# Patient Record
Sex: Female | Born: 1960 | Race: Black or African American | Hispanic: No | Marital: Married | Smoking: Never smoker
Health system: Southern US, Community
[De-identification: ages and names within clinical notes are randomized; demographics above are authoritative.]

## PROBLEM LIST (undated history)

## (undated) DIAGNOSIS — R112 Nausea with vomiting, unspecified: Secondary | ICD-10-CM

## (undated) DIAGNOSIS — E785 Hyperlipidemia, unspecified: Secondary | ICD-10-CM

## (undated) DIAGNOSIS — N19 Unspecified kidney failure: Secondary | ICD-10-CM

## (undated) DIAGNOSIS — Z9889 Other specified postprocedural states: Secondary | ICD-10-CM

## (undated) DIAGNOSIS — D649 Anemia, unspecified: Secondary | ICD-10-CM

## (undated) HISTORY — DX: Anemia, unspecified: D64.9

## (undated) HISTORY — DX: Unspecified kidney failure: N19

## (undated) HISTORY — PX: ABDOMINAL HYSTERECTOMY: SHX81

## (undated) HISTORY — DX: Hyperlipidemia, unspecified: E78.5

## (undated) HISTORY — PX: OTHER SURGICAL HISTORY: SHX169

---

## 2002-01-26 ENCOUNTER — Encounter: Payer: Self-pay | Admitting: *Deleted

## 2002-01-26 ENCOUNTER — Emergency Department (HOSPITAL_COMMUNITY): Admission: EM | Admit: 2002-01-26 | Discharge: 2002-01-26 | Payer: Self-pay | Admitting: *Deleted

## 2002-02-02 ENCOUNTER — Ambulatory Visit (HOSPITAL_COMMUNITY): Admission: RE | Admit: 2002-02-02 | Discharge: 2002-02-02 | Payer: Self-pay | Admitting: Orthopaedic Surgery

## 2002-02-02 ENCOUNTER — Encounter: Payer: Self-pay | Admitting: Orthopaedic Surgery

## 2002-02-17 ENCOUNTER — Encounter (HOSPITAL_COMMUNITY): Admission: RE | Admit: 2002-02-17 | Discharge: 2002-03-19 | Payer: Self-pay | Admitting: Orthopedic Surgery

## 2002-04-20 ENCOUNTER — Ambulatory Visit (HOSPITAL_COMMUNITY): Admission: RE | Admit: 2002-04-20 | Discharge: 2002-04-20 | Payer: Self-pay | Admitting: Obstetrics and Gynecology

## 2002-04-20 ENCOUNTER — Encounter: Payer: Self-pay | Admitting: Obstetrics and Gynecology

## 2003-05-16 ENCOUNTER — Ambulatory Visit (HOSPITAL_COMMUNITY): Admission: RE | Admit: 2003-05-16 | Discharge: 2003-05-16 | Payer: Self-pay | Admitting: Obstetrics and Gynecology

## 2003-06-16 ENCOUNTER — Inpatient Hospital Stay (HOSPITAL_COMMUNITY): Admission: RE | Admit: 2003-06-16 | Discharge: 2003-06-19 | Payer: Self-pay | Admitting: Obstetrics and Gynecology

## 2003-09-26 ENCOUNTER — Inpatient Hospital Stay (HOSPITAL_COMMUNITY): Admission: EM | Admit: 2003-09-26 | Discharge: 2003-10-04 | Payer: Self-pay | Admitting: Emergency Medicine

## 2004-06-02 ENCOUNTER — Emergency Department (HOSPITAL_COMMUNITY): Admission: EM | Admit: 2004-06-02 | Discharge: 2004-06-02 | Payer: Self-pay | Admitting: Emergency Medicine

## 2004-06-04 ENCOUNTER — Inpatient Hospital Stay (HOSPITAL_COMMUNITY): Admission: AD | Admit: 2004-06-04 | Discharge: 2004-06-26 | Payer: Self-pay | Admitting: General Surgery

## 2005-08-09 LAB — HM MAMMOGRAPHY: HM Mammogram: NORMAL

## 2005-08-13 ENCOUNTER — Ambulatory Visit (HOSPITAL_COMMUNITY): Admission: RE | Admit: 2005-08-13 | Discharge: 2005-08-13 | Payer: Self-pay | Admitting: General Surgery

## 2005-10-23 ENCOUNTER — Encounter (INDEPENDENT_AMBULATORY_CARE_PROVIDER_SITE_OTHER): Payer: Self-pay | Admitting: *Deleted

## 2005-10-23 ENCOUNTER — Ambulatory Visit: Payer: Self-pay | Admitting: Family Medicine

## 2005-10-28 ENCOUNTER — Encounter (INDEPENDENT_AMBULATORY_CARE_PROVIDER_SITE_OTHER): Payer: Self-pay | Admitting: Family Medicine

## 2005-10-28 LAB — CONVERTED CEMR LAB: RBC count: 3.94 10*6/uL

## 2005-11-06 ENCOUNTER — Ambulatory Visit: Payer: Self-pay | Admitting: Family Medicine

## 2006-01-16 IMAGING — CR DG CHEST 1V PORT
1 series · 1 of 1 positions shown · non-contrast
Comparison: 10/04/03
 AP semiupright chest at 2331 hours shows low lung volumes with bibasilar atelectasis.

CLINICAL DATA: acute abdominal pain; post-op surgery
 PORTABLE CHEST- 1 VIEW:

[view not recorded]
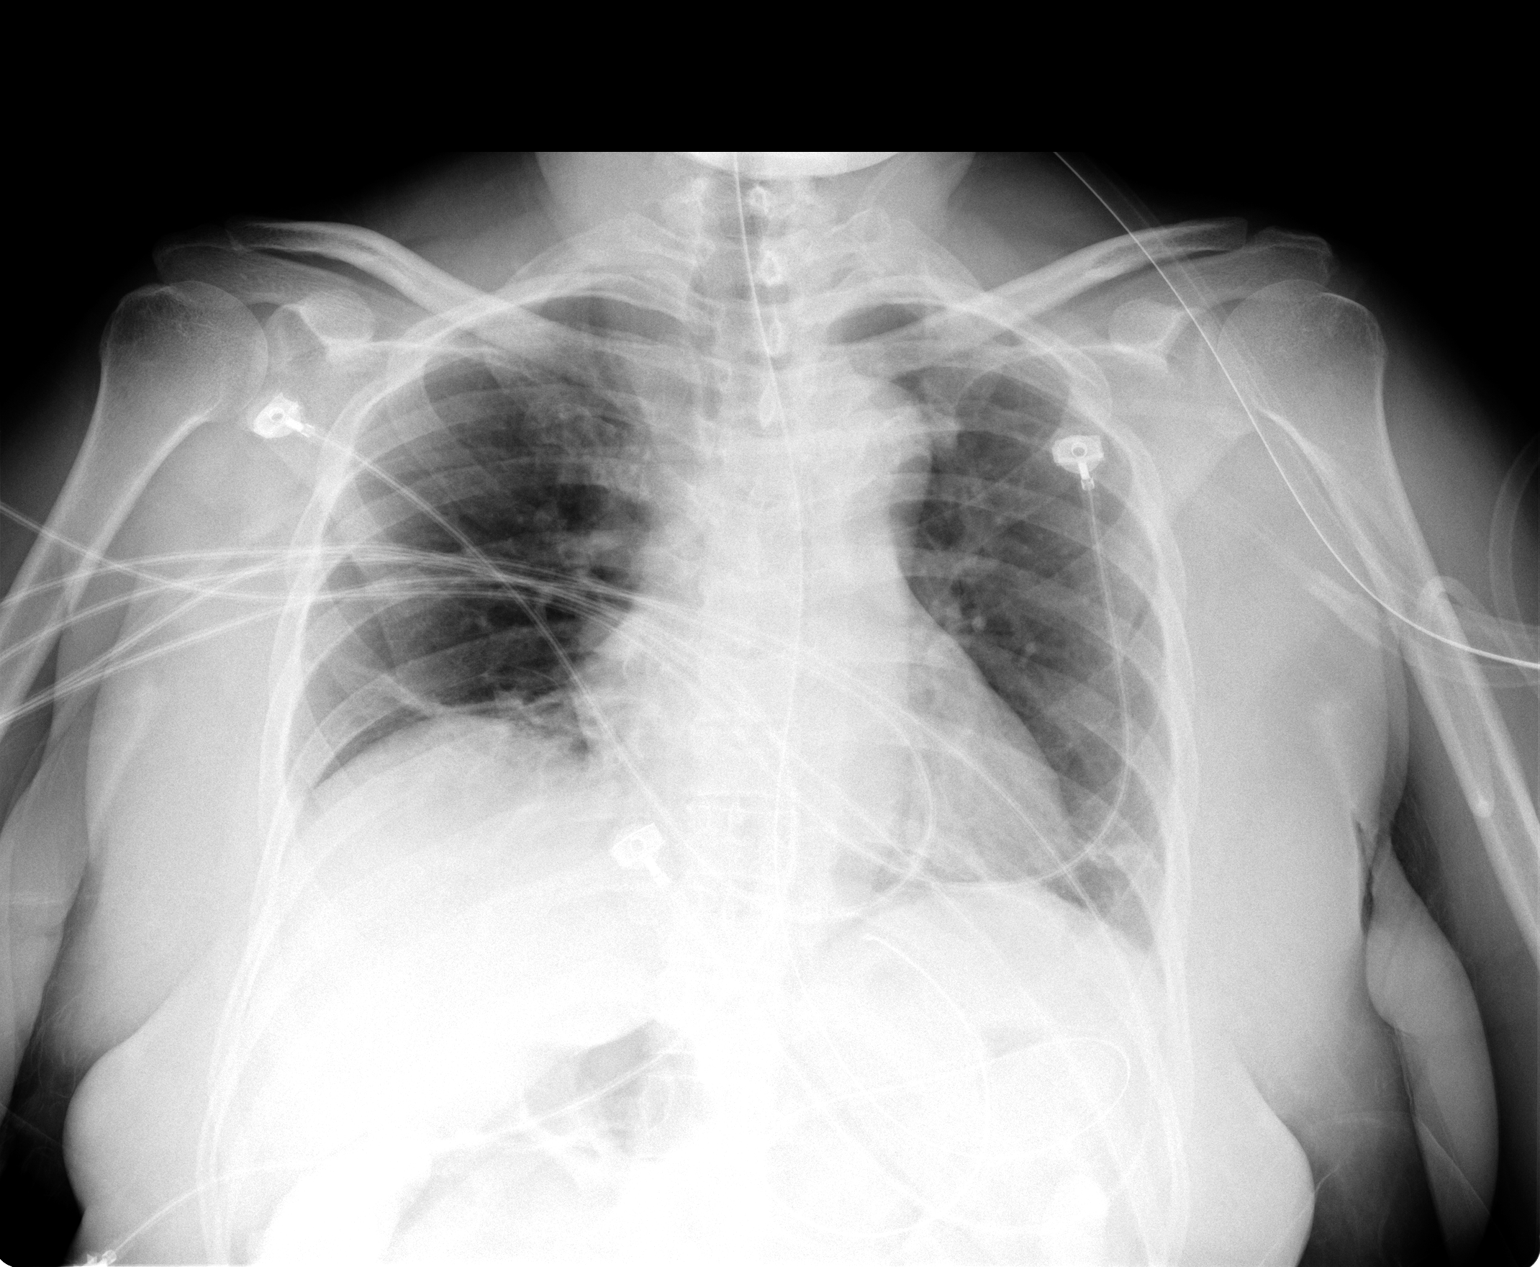

[1 of 1 positions shown; findings below may reference images not displayed]

The cardiopericardial silhouette is at upper limits of normal for size.  NG tube is coiled in the stomach.
IMPRESSION: Low volumes with bibasilar atelectasis.

## 2006-01-20 IMAGING — CR DG ABDOMEN 2V
2 series · 2 of 2 positions shown · non-contrast
Comparison: none

CLINICAL DATA: 43 year old female, acute abdominal pain, possible obstruction. 
 2-VIEW ABDOMINAL SERIES:

[view not recorded (1 of 2)]
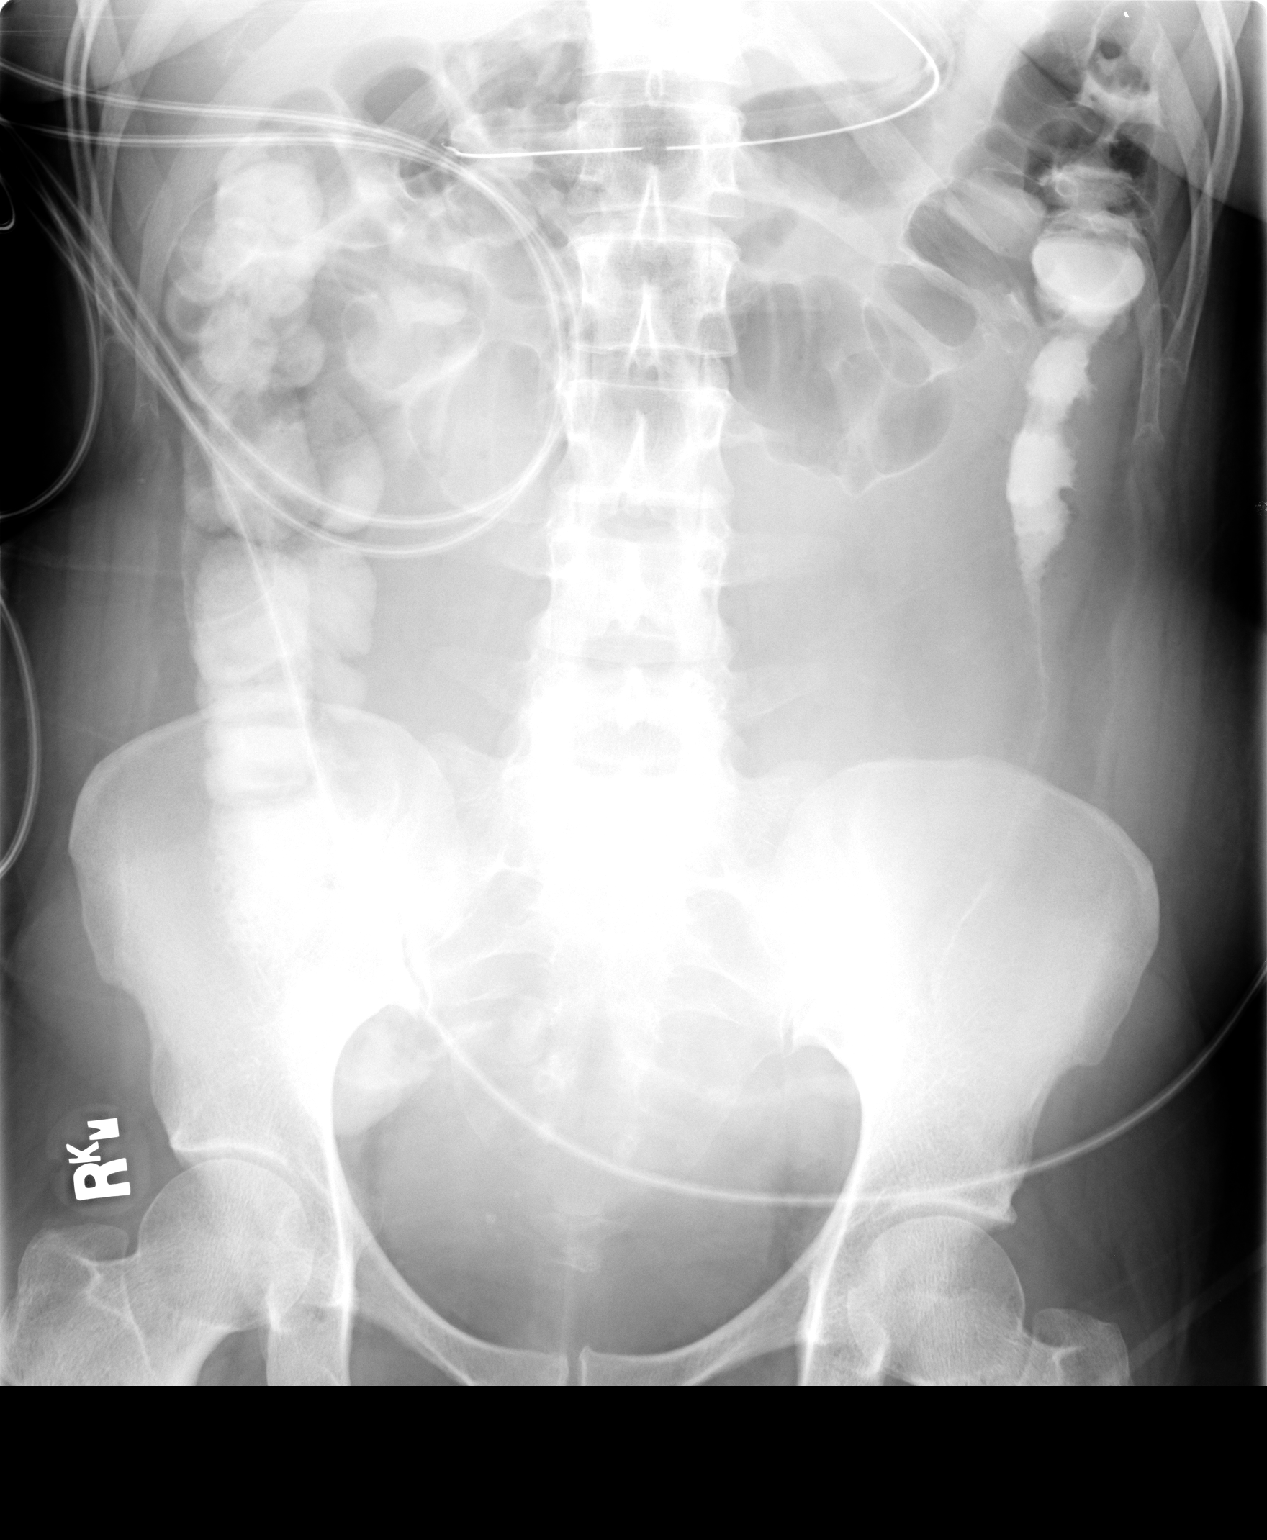

[view not recorded (2 of 2)]
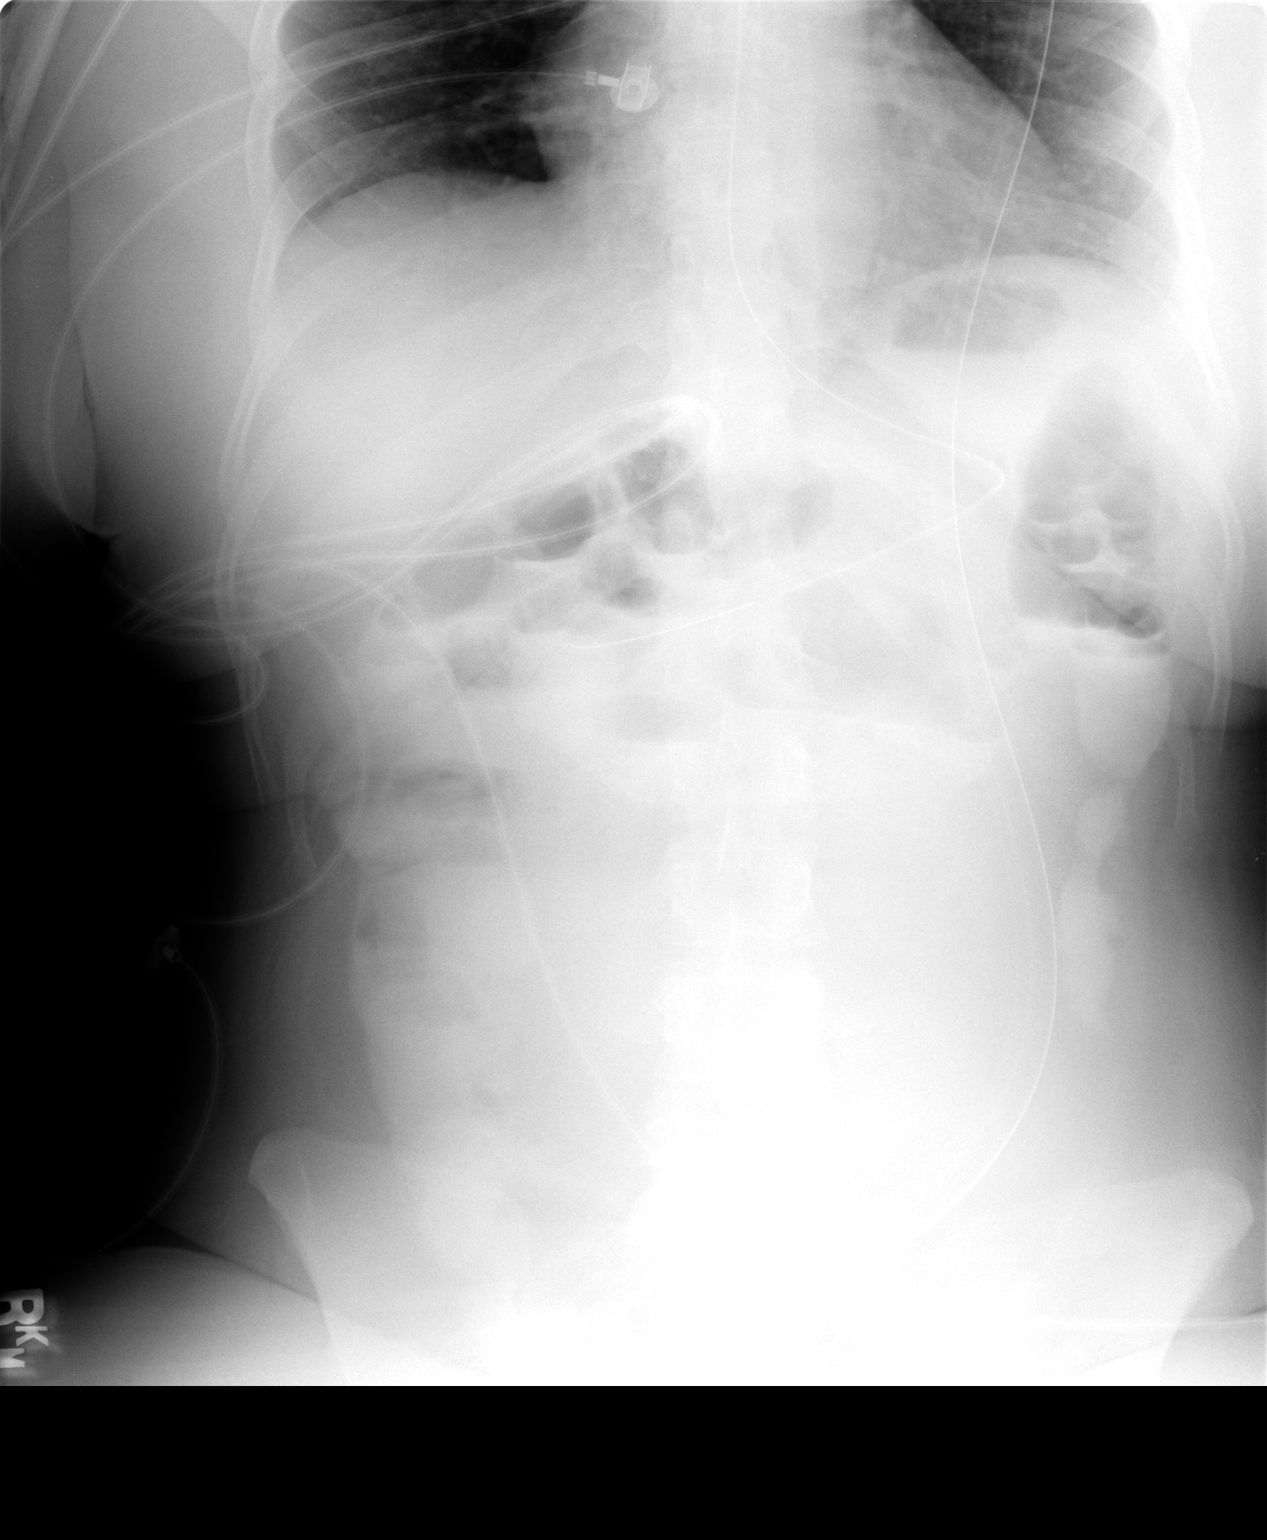

[2 of 2 positions shown; findings below may reference images not displayed]

FINDINGS: On the upright exam, there are central air fluid levels and small bowel loops with minimal distention and air.  Contrast remains present throughout the colon in a similar pattern.  The small bowel air fluid levels have developed since 06/15/04 and are nonspecific but can be seen with a developing partial obstruction.  No free air.
IMPRESSION: New central small bowel air fluid levels with slow to clear contrast in the colon. Query developing partial small bowel obstruction.

## 2006-01-28 IMAGING — CR DG SMALL BOWEL
4 series · 4 of 4 positions shown · non-contrast
Comparison: none

CLINICAL DATA: Abdominal pain.  
 SMALL BOWEL SERIES:
 Small bowel transit time is within normal limits with barium enema in the colon at one hour.  Although the small bowel is slightly dilated, there is no transition, and there is no persistent filling defects or obstructions. The terminal ileum is visualized and has a normal appearance.

[view not recorded (1 of 4)]
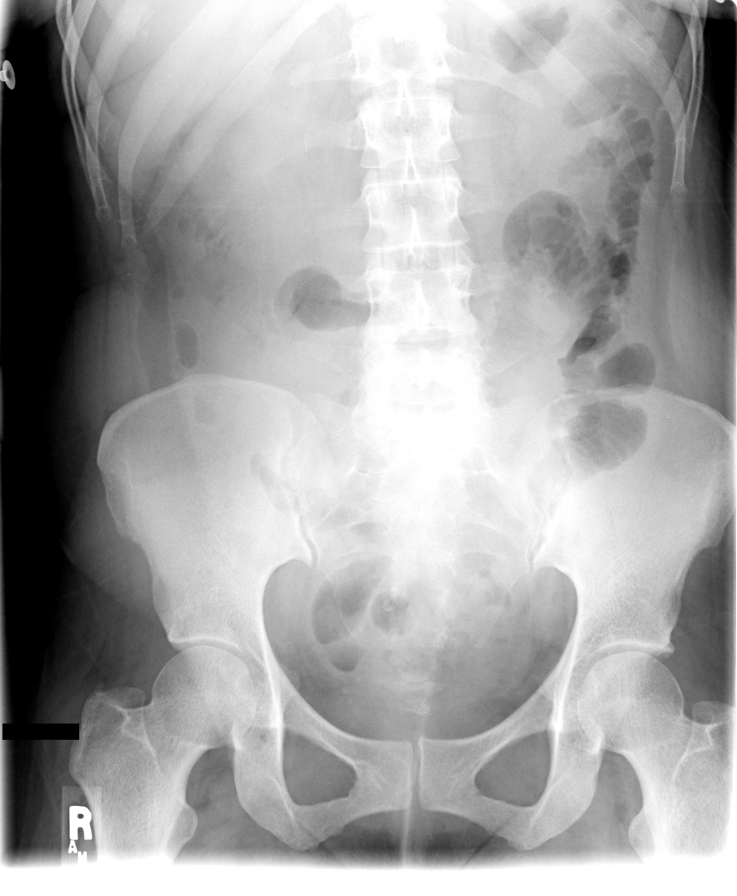

[view not recorded (2 of 4)]
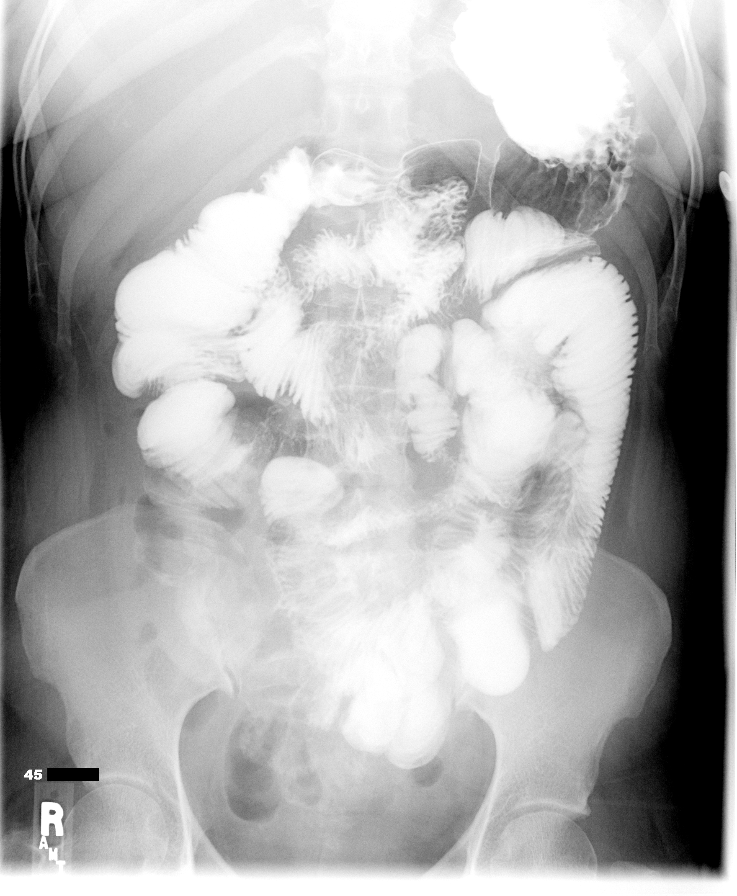

[view not recorded (3 of 4)]
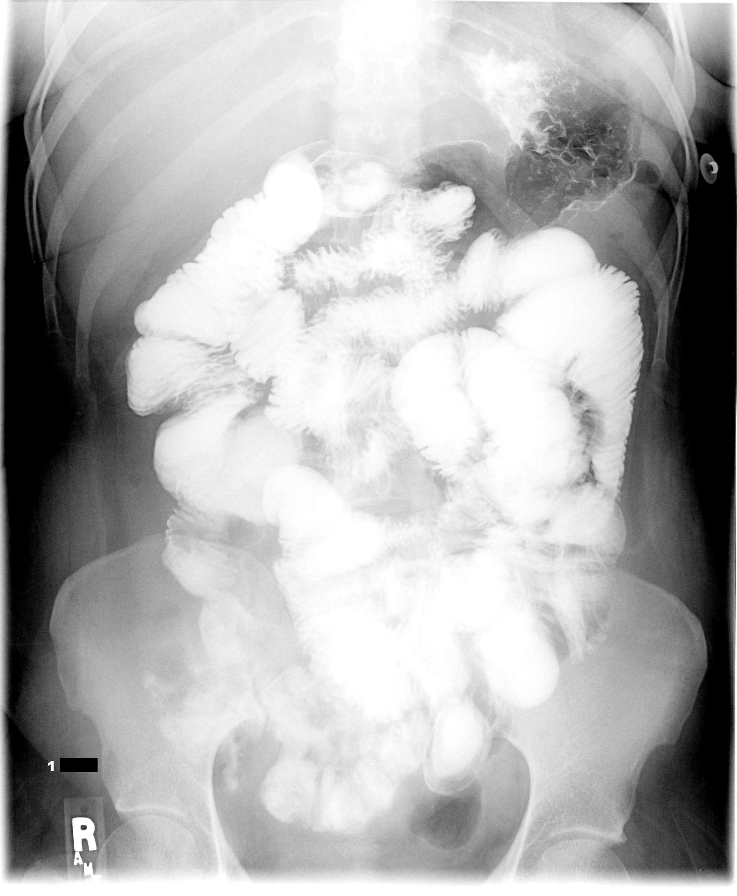

[view not recorded (4 of 4)]
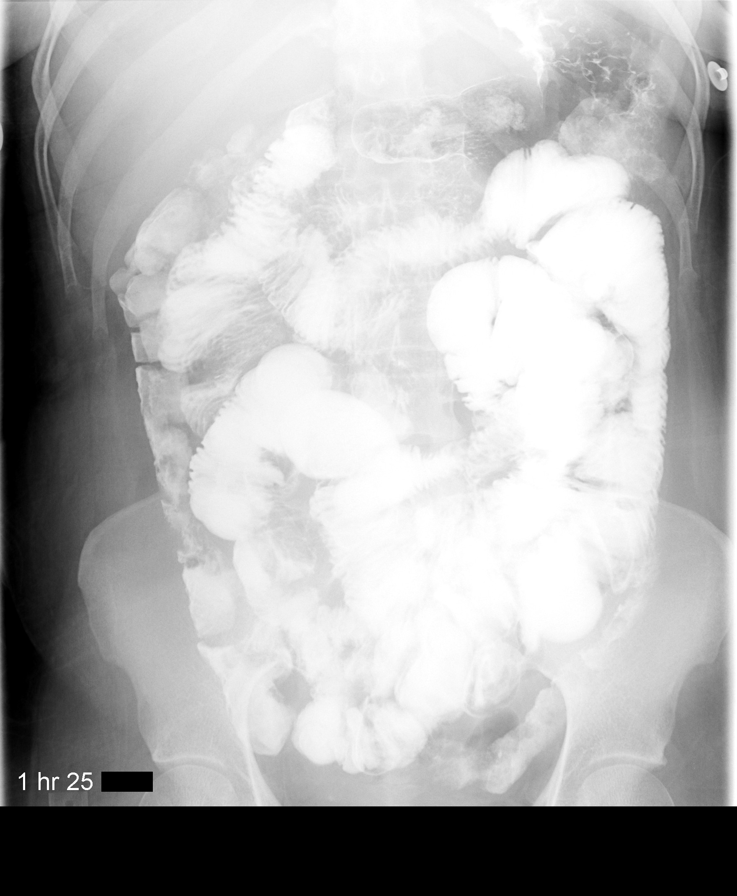

[4 of 4 positions shown; findings below may reference images not displayed]

IMPRESSION: No evidence for small bowel obstruction with normal small bowel transit time.  Mildly diffusely dilated small bowel noted without focal abnormality.

## 2006-03-25 ENCOUNTER — Encounter: Payer: Self-pay | Admitting: Family Medicine

## 2006-03-25 DIAGNOSIS — E785 Hyperlipidemia, unspecified: Secondary | ICD-10-CM

## 2006-03-25 DIAGNOSIS — D649 Anemia, unspecified: Secondary | ICD-10-CM | POA: Insufficient documentation

## 2006-03-25 DIAGNOSIS — K56609 Unspecified intestinal obstruction, unspecified as to partial versus complete obstruction: Secondary | ICD-10-CM | POA: Insufficient documentation

## 2006-03-25 DIAGNOSIS — N19 Unspecified kidney failure: Secondary | ICD-10-CM | POA: Insufficient documentation

## 2006-03-25 DIAGNOSIS — L910 Hypertrophic scar: Secondary | ICD-10-CM

## 2006-03-25 DIAGNOSIS — E669 Obesity, unspecified: Secondary | ICD-10-CM

## 2006-08-28 ENCOUNTER — Encounter (INDEPENDENT_AMBULATORY_CARE_PROVIDER_SITE_OTHER): Payer: Self-pay | Admitting: Family Medicine

## 2007-03-24 ENCOUNTER — Encounter (INDEPENDENT_AMBULATORY_CARE_PROVIDER_SITE_OTHER): Payer: Self-pay | Admitting: Family Medicine

## 2010-03-21 ENCOUNTER — Emergency Department (HOSPITAL_COMMUNITY)
Admission: EM | Admit: 2010-03-21 | Discharge: 2010-03-21 | Payer: Self-pay | Source: Home / Self Care | Admitting: Emergency Medicine

## 2010-03-21 ENCOUNTER — Encounter: Payer: Self-pay | Admitting: Orthopedic Surgery

## 2010-03-24 ENCOUNTER — Encounter: Payer: Self-pay | Admitting: Orthopedic Surgery

## 2010-03-27 ENCOUNTER — Encounter: Payer: Self-pay | Admitting: Orthopedic Surgery

## 2010-03-27 ENCOUNTER — Ambulatory Visit
Admission: RE | Admit: 2010-03-27 | Discharge: 2010-03-27 | Payer: Self-pay | Source: Home / Self Care | Attending: Orthopedic Surgery | Admitting: Orthopedic Surgery

## 2010-03-27 DIAGNOSIS — S93409A Sprain of unspecified ligament of unspecified ankle, initial encounter: Secondary | ICD-10-CM | POA: Insufficient documentation

## 2010-04-12 NOTE — Letter (Signed)
Summary: History form  History form   Imported By: Jacklynn Ganong 04/03/2010 13:32:31  _____________________________________________________________________  External Attachment:    Type:   Image     Comment:   External Document

## 2010-04-12 NOTE — Letter (Signed)
Summary: FMLA form   FMLA form   Imported By: Cammie Sickle 04/06/2010 18:03:14  _____________________________________________________________________  External Attachment:    Type:   Image     Comment:   External Document

## 2010-04-12 NOTE — Assessment & Plan Note (Signed)
Summary: [facility] FOL/UP/RT ANKLE SPRAIN/XR [facility] 03/21/10/[employer] PPO/CAF   Vital Signs:  Patient profile:   50 year old female Height:      62.5 inches Weight:      187 pounds BMI:     33.78 Pulse rate:   80 / minute Resp:     16 per minute  Vitals Entered By: Fuller Canada MD (March 27, 2010 10:14 AM)  Visit Type:  new patient Referring Provider:  [facility] Primary Provider:  NA  CC:  right ankle pain.  History of Present Illness: I saw [name] in the office today for an initial visit.  She is a 50 years old woman with the complaint of:  right ankle pain.  DOI 03/21/10.  Xrays [facility] right ankle on 03/21/10.  Meds: none.  Complaints: swelling along the RIGHT ankle with pain levels between 2 and 4, which are intermittent, secondary to a fall while feeding her dog. She says she is much improved after wearing her ankle ASO brace    Allergies (verified): No Known Drug Allergies  Family History: Hx, family, asthma  Social History: Patient is married.  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 Cardiovascular:  Denies chest pain, palpitations, fainting, and murmurs. Respiratory:  Denies short of breath, wheezing, couch, tightness, pain on inspiration, and snoring . Gastrointestinal:  Denies heartburn, nausea, vomiting, diarrhea, constipation, and blood in your stools. Genitourinary:  Denies frequency, urgency, difficulty urinating, painful urination, flank pain, and bleeding in urine. Neurologic:  Complains of numbness; denies tingling, unsteady gait, dizziness, tremors, and seizure. Musculoskeletal:  Denies joint pain, swelling, instability, stiffness, redness, heat, and muscle pain. Endocrine:  Denies excessive thirst, exessive urination, and heat or cold intolerance. Psychiatric:  Denies nervousness, depression, anxiety, and hallucinations. Skin:   Denies changes in the skin, poor healing, rash, itching, and redness. HEENT:  Denies blurred or double vision, eye pain, redness, and watering. Immunology:  Denies seasonal allergies, sinus problems, and allergic to bee stings. Hemoatologic:  Denies easy bleeding and brusing.  Physical Exam  Additional Exam:  GEN: well developed, well nourished, normal grooming and hygiene, no deformity and normal body habitus.   CDV: pulses are normal, no edema, no erythema. no tenderness  Lymph: normal lymph nodes   Skin: no rashes, skin lesions or open sores   NEURO: normal coordination, reflexes, sensation.   Psyche: awake, alert and oriented. Mood normal   Gait: normal  Inspection reveals tenderness and swelling over the anterior talofibular ligament. Her range of motion is 25 of plantarflexion. Active 10 of dorsiflexion and passive 15 of dorsiflexion. She has normal strength in plantarflexion and dorsiflexion with 4/5 strength with eversion. The drawer test is firm and stable     Other Orders: New Patient Level III (52841)  Patient Instructions: 1)  Wear Brace x 1 week  2)  Do exercises x 2 weeks 3)  Please schedule a follow-up appointment as needed.   Orders Added: 1)  New Patient Level III [32440]

## 2010-04-12 NOTE — Letter (Signed)
Summary: Out of Work  [facility]  [address]  [place], [place] [address]   Phone: [phone]  Fax: [phone]    March 27, 2010   Employee:  [name] Hassing    To Whom It May Concern:   For Medical reasons, please excuse the above named employee from work for the following dates:  Start:   03/21/2010  End:   03/23/2010  May return 01/14/20112  If you need additional information, please feel free to contact our office.         Sincerely,    Dr. Terrance Mass.

## 2010-07-27 NOTE — Op Note (Signed)
[name], [name]                            ACCOUNT NO.:  [id number]   MEDICAL RECORD NO.:  [id number]                   PATIENT TYPE:  INP   LOCATION:  A408                                 FACILITY:  [facility]   PHYSICIAN:  Tilda Burrow, M.D.              DATE OF BIRTH:  Nov 29, 1960   DATE OF PROCEDURE:  06/16/2003  DATE OF DISCHARGE:                                 OPERATIVE REPORT   PREOPERATIVE DIAGNOSES:  1. Uterine fibroids.  2. Chest wall keloids.   POSTOPERATIVE DIAGNOSES:  1. Uterine fibroids.  2. 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  3. Chest wall keloids.   PROCEDURE:  1. Total abdominal hysterectomy.  2. Bilateral salpingo-oophorectomy.  3. Appendectomy.  4. Extensive lysis of adhesions.  5. Excision of keloids.   SURGEON:  Tilda Burrow, M.D.   ASSISTANTAmie Critchley, CST   ANESTHESIA:  General.   COMPLICATIONS:  Two areas of thinned tissue from the sigmoid where it was  peeled off of the uterine fundus requiring oversewing.   ESTIMATED BLOOD LOSS:  1500 cc.   FLUIDS:  Two units of packed cells plus crystalloid.   FINDINGS:  Extensive adhesions from the sigmoid to the apex of the uterus  even though it was above the umbilicus.  Extensive pelvic adhesions with  paracytic blood supply.   DETAILS OF PROCEDURE:  The patient was taken to the operating room, prepped  and draped in the usual fashion, for lower abdominal surgery.  The prior  vertical lower abdominal incision was repeated, with excision of old  cicatrix, removing 1 cm keloid scar.  Entry into the abdominal cavity in the  midline was without difficulty and there were virtually no adhesions to the  anterior abdominal wall.  The uterus was extremely irregular with multiple  fibroids exophytic in character.  We opened the abdomen from the umbilicus  to the symphysis pubis.  The bladder flap was quite high on the uterine  fundus.   It was obvious that extensive adhesions existed  and dissection was going to  be slow.  We started by attempting to normalize anatomy by identifying the  small bowel that was stuck to the fundus of the uterus and we carefully,  sharply dissected the small bowel off of the uterine fundus with cautious  dissection with Metzenbaum scissors and transection of adhesions as  necessary.  Small bowel could be gradually peeled off the back of the  uterine fundus.  There was addition bowel that was on the posterior aspect  of the uterine fundus and it was necessary to extend the incision  approximately 4 cm past the umbilicus in order to gain adequate access to  the top of the uterus.  Grasping of the fibroid with the Lahey thyroid  tenaculum allowed [place] to rotate the uterus, somewhat, in order to continue  the dissection and to gradually, steadily make progress  at freeing things  up.  Over a large pedunculated fibroid in the left fundal portion of the  uterus, which was up-to-or-above the umbilicus, we were able to gradually  peel the bowel away.  It became apparent that this was sigmoid colon.   There was one area that was thinned out but had good perfusion.  There was  no suspicion of bowel perforation.  The area of sigmoid colon dissection was  eventually able to be freed up such that the entire uterine fundus was  mobile.  The left ovary was identified as a 7 cm cystic structure.  This was  considered to be inflammatory in origin to the size and cystic nature.  We  were then able to identify the right round ligament, doubly clamping,  cutting, and suture ligating.  The left round ligament required similar  therapy.  Once the dissection had finally been completed we were able to  isolate the left infundibulopelvic pelvic ligament and doubly clamp, cut,  and suture ligate the ligament.  Hemostasis was good.   The ureter was palpated and visualized in the retroperitoneum prior to cross-  clamping the left IP ligament.  It was impossible to save  the ovaries due to  their adhesions and involvement in the adhesive process.  We then identified  the left side which included the large 7 cm cyst.  Eventually dissection  allowed proper identified of the left round ligament which was huge and  firm; which was taken down, clamped, cut, and suture ligated.  Then we  isolated the left infundibulopelvic ligament clamping, cutting, and suture  ligating.  The ureter could be palpated in the retroperitoneum and it was  well out of the surgical field.   We then isolated the uterine vessels by clamping, cutting, and suture  ligating with #0 chromic.  At this time we finally had the uterus  sufficiently mobilized including mobilization of the bladder flap, that we  could then proceed with transection of the left uterine vessels which were  doubly clamped with curved Heaney clamps, a Kelly clamp placed for  backbleeding just as the other side had been handled, and then we were able  to clamped, cut, and suture ligate those uterine vessels.  Marching down the  cardinal ligament on either side with serial small bites was successful and  then we were able to enter the vaginal apex by a single stab incision in the  anterior cervical edge of the fornix followed by placement of a Kocher clamp  and circumscribing the cut vaginal cuff off of the surgical specimen.  We  were then able to reapproximate the vaginal cuff with interrupted sutures of  2-0 chromic.  Generous irrigation confirmed that there had been some  bleeding from the remnant of the left IP ligament.  We were able to identify  this loss of pedicle and doubly clamp, cut, and suture ligate the  infundibulopelvic ligament.   The pelvis was irrigated and the bowel inspection along the small bowel and  the entire sigmoid and there was the area of, previously mentioned, thinned  tissue which required interrupted, 2-0 silk sutures to reapproximate the somewhat thinned out tissues and protect the  peritoneum.  The sigmoid  portion of the bowel was extremely long due to its stretched nature by its  attachments to the uterine fibroid.   We then proceeded with closing the vaginal cuff using 2-0 chromic.  The  pelvis was irrigated again, confirmed as hemostatic.  The  anterior  peritoneum closed with 2-0 chromic, the fascia closed with #0 Vicryl and  subcu tissues irrigated and confirmed as hemostatic and pulled together with  interrupted sutures of 2-0 plain.  Staple closure of the skin completed the  procedure.  The patient tolerated the procedure well went to recovery room  in good condition.      ___________________________________________                                            Tilda Burrow, M.D.   JVF/MEDQ  D:  06/17/2003  T:  06/17/2003  Job:  [id number]

## 2010-07-27 NOTE — Consult Note (Signed)
NAME:  [name], [name]                            ACCOUNT NO.:  [id number]   MEDICAL RECORD NO.:  [id number]                   PATIENT TYPE:  INP   LOCATION:  IC04                                 FACILITY:  [facility]   PHYSICIAN:  Lionel December, M.D.                 DATE OF BIRTH:  [birth year]   DATE OF CONSULTATION:  10/03/2003  DATE OF DISCHARGE:                                   CONSULTATION   REASON FOR CONSULTATION:  Elevated transaminases and DIC in a patient status  post laparotomy for small bowel obstruction.   HISTORY OF PRESENT ILLNESS:  [name] is a 50 year old African-American female  who presented with recent abdominal pain, had nausea and vomiting felt to be  secondary to small bowel obstruction.  She did not respond to medical  therapy.  She therefore had laparotomy on September 30, 2003.  She had multiple bands which were lysed and obstruction relieved.  On  postoperative day 1 she developed hypotension and tachycardia.  She was  given large doses of IV fluids and Hespan and subsequently transferred to  ICU.  Her CBC on admission (September 26, 2003) and on July 22 was within normal  limits.  By postoperative day 1, her hemoglobin and hematocrit were 10.1 and  29; however, yesterday, her H&H were 8.7 and 25.7 and her platelet count was  51,000.  She had a DIC yesterday. Her prothrombin time was 37.6, INR was  6.5.  PTT was 33.  Her fibrinogen was low at 187 mg/dl and her D-dimer was  greater than 20.  Her bilirubin was 2.6, direct 1.5, AP was 38, ASC 3339,  ALT 1791 and albumin was 1.8.  Her creatinine also increased to normal to  2.3 yesterday and this morning 3.4.  BUN was 25. ____________ to 2.3  yesterday and post hydration dropped to 1.9.  Her creatinine today was 3.4.  She has been seen by Jorja Loa, M.D. and begun on Lasix infusion.   The patient was also given dopamine two days ago, but now she is off it.  She received two units of packed red blood cells yesterday, four  units of  fresh frozen plasma and two more units of FFP today.   The patient is sedated and unable to provide any history, most of which was  obtained from her husband.  Prior to onset of her abdominal pain she has  been in good health.  There is no history of bleeding disorder, diarrhea or  rectal bleeding.  She is presently on Zosyn 4.5 g IV every eight hours,  metronidazole 500 mg IV every six hours, Solu-Medrol 250 mg IV every six  hours and she will be getting the fourth and the last dose today, Lasix  infusion at a rate of 25 mg per hour, albumin 12.5 g IV twice daily and she  received a dose of gentamicin 240 mg  IV yesterday and she got a dose of  Kayexalate.   She had renal ultrasound this morning which revealed normal sized kidneys.  Pelvis was not well seen.   PAST MEDICAL HISTORY:  She had a keloid removed from her left ear several  years ago.  She had uterine fibroid removed in 1995 and finally she had  bilateral salpingo-oophorectomy with hysterectomy in April 2005.   ALLERGIES:  No known drug allergies.   FAMILY HISTORY:  Positive for hypertension on father's side and heart  disease on mother's side.  She has five sisters and one half brother,  generally in good health.   SOCIAL HISTORY:  She is married.  She has been working as a Administrator, sports for  years.  She presently working at [employer].  She has never smoked cigarettes  and does not drink alcohol.  She does not have any children.   PHYSICAL EXAMINATION:  GENERAL:  The patient is a well-developed, well-  nourished African-American female who appears to be in no acute distress but  she is sedated.  VITAL SIGNS:  Admission weight 174.6 pounds.  Weight today was 207.6 pounds.  She is 64 inches tall.  Pulse 70 per minute, blood pressure 137/89,  respiratory rate 20 and temperature is 97.  Her  temperature two days ago  was 101.9.  HEENT:  Conjunctiva is very edematous.  Sclera is nonicteric.  She has a  nasogastric tube in  place.  NECK:  No neck masses are noted.  HEART:  Cardiac exam revealed a regular rhythm, normal S1 and S3, no murmur  or gallop noted.  LUNGS:  Clear anteriorly.  ABDOMEN:  Full.  Bowel sounds are absent.  It is soft across the upper half,  tense in lower midabdomen and she appears to be tender.  No definite mass is  noted.  EXTREMITIES:  She does note have lower extremity edema.  She has TED  stockings.   LABORATORY DATA:  Labs from today:  White blood cell count is 5700,  hemoglobin 9.2, hematocrit 26.7, platelet count 46,000.  She has 90 segs.  Arterial blood gases from today, pH is 7.257, PCO2 42, PO2 138.  pH  yesterday was 7.211 and yesterday evening was 7.23.  Her PT from this  morning was 27.5 with INR of 3.7 and PTT is 36.  Sodium 142, potassium 6,  chloride 112, CO2 21, glucose 122, BUN 25, creatinine 3.4, bilirubin is 2.5,  directly 1.5.  AP 45, ASD 3850, ALT 1395, albumin 2.5.  Calcium 7.8.   ASSESSMENT:  [name] is a 50 year old African-American female who is status  post laparotomy for small bowel obstruction with lysis of adhesions three  days ago who has developed an episode of tachycardia as well as fever and  now has  DIC, renal failure, and marked elevated transaminases.  As far as  transaminitis is concerned, it is felt to be due to ischemic hepatitis or  shock injury and this is almost always reversible as long as underlying  processes is treated.   Given her DIC and renal failure, I am concerned that there is an acute  abdominal process such as bleeding more  likely infarction from internal  herniation or loop obstruction.  Her abdominal examination is very worrisome  to me.  I feel that she may need laparotomy unless things turn around with  supportive therapy.   RECOMMENDATIONS:  Will proceed with abdominal pelvic CT with oral contrast. She will not be given IV contrast  because of renal failure. I have discussed  her condition with her husband and sisters and  also reviewed my impression  and recommendations with Jerolyn Shin C. Katrinka Blazing, M.D.   I would like to thank Jerolyn Shin C. Katrinka Blazing, M.D. for opportunity to participate in  Ms. Neace's care.      ___________________________________________                                            Lionel December, M.D.   NR/MEDQ  D:  10/03/2003  T:  10/03/2003  Job:  [id number]

## 2010-07-27 NOTE — Discharge Summary (Signed)
[name], [name]                  ACCOUNT NO.:  [id number]   MEDICAL RECORD NO.:  [id number]          PATIENT TYPE:  INP   LOCATION:  A207                          FACILITY:  [facility]   PHYSICIAN:  Dirk Dress. Katrinka Blazing, M.D.   DATE OF BIRTH:  1960/03/22   DATE OF ADMISSION:  06/04/2004  DATE OF DISCHARGE:  04/18/2006LH                                 DISCHARGE SUMMARY   DISCHARGE DIAGNOSES:  1.  Recurrent small-bowel obstruction.  2.  Acute on chronic renal failure.  3.  Anemia.  4.  Hypokalemia.   SPECIAL PROCEDURES:  Extensive small bowel adhesiolysis April 4.   DISPOSITION:  The patient discharged home in stable improved condition.   DISCHARGE MEDICATIONS:  1.  Potassium chloride 20 mEq b.i.d.  2.  Zelnorm 6 mg b.i.d.  3.  Reglan 10 mg before every meal and bedtime.  4.  Phenergan 25 mg every 4 hours as needed for nausea.  5.  Lortab 7.5/500 q.i.d. p.r.n. pain.   She is scheduled to be seen in the office 2 weeks post discharge.   SUMMARY:  [age]-year-old female with a very complicated history who  presented with a complaint of severe constipation. Symptoms were more severe  with nausea/vomiting 3 days prior to admission. The patient was admitted  because of severe distress. She had a prior history of extensive small-bowel  adhesions and obstruction with a prolonged complicated postoperative course  which is well outlined in the operative note. Nevertheless, on this  hospitalization, the patient had what appeared to be a developing small-  bowel obstruction. It was felt initially that she would need to have  operative therapy and this was discussed in detail with her and her husband.  She was told that it would be best if she went to [facility] because  of her previous complications. She did not wish to do this. The patient was  therefore monitored. Her hypokalemia was corrected. She was resistant to  nasogastric tube suction even though she had recurrent episodes of  nausea/vomiting. Over the next 2 days multiple discussions were had with the  patient and she still wished to have the surgery done here but wished to  delay it. On March 29 she actually had six bowel movements and her abdomen  was much softer. It was decided to delay surgery at that time. She had six  small bowel movements the following day. Follow-up abdominal series April 1  showed: Small bowel gas with some distension of small bowel with gas in the  left colon and rectum and some gas and a partially decompressed small bowel  in the mid abdomen. We tried to advance her diet. On April 3 small-bowel  follow-through was done and this showed transit to the cecum in 2 hours with  a high-grade stricture of the terminal ileum. Contrast however, extended  through the stricture into the distal colon. It was felt that she was not  going to improve enough to go home unless she had operative therapy. We  again discussed it with the patient and her husband and they  were agreeable.  She underwent extensive small bowel adhesiolysis on April 4. She had two  areas of high-grade obstruction in the proximal small bowel due to an  omental band and in the distal small bowel due to tight adhesions. In the  postoperative period she remained stable, she did not have any hemodynamic  difficulties, urine output remained stable and renal function was stable,  she had no change in her clotting studies, no significant change in her  liver function studies. She did not develop any major abdominal distension  during this hospitalization. She started having loose bowel movements on  April 11. This gradually improved. Her diet was gradually increased. We  tried to encourage the patient to have central line placement for TPN but  she did not wish to have this done. By April 18 she was doing well, she had  no nausea or vomiting, she was having regular bowel movements, she was  tolerating a regular diet and at this time it  was felt that she was stable  enough for discharge home. She was discharged home in stable with excellent  bowel function.       LCS/MEDQ  D:  08/18/2004  T:  08/18/2004  Job:  [id number]

## 2010-07-27 NOTE — H&P (Signed)
NAME:  [name], [name]                            ACCOUNT NO.:  [id number]   MEDICAL RECORD NO.:  [id number]                   PATIENT TYPE:  INP   LOCATION:  A219                                 FACILITY:  [facility]   PHYSICIAN:  Dirk Dress. Katrinka Blazing, M.D.                DATE OF BIRTH:  [birth year]   DATE OF ADMISSION:  09/26/2003  DATE OF DISCHARGE:                                HISTORY & PHYSICAL   A 50 year old female admitted for evaluation of possible intestinal  obstruction.  The patient gives a history of having hysterectomy on 16 June 2003 by Dr. Emelda Fear.  She states that she did well in the postoperative  period and was recovering uneventfully.  She developed right lower quadrant  abdominal pain on Thursday of this past week which was four days prior to  admission.  The pain became more severe and extended across her abdomen.  She would then have paroxysms with severe pain which would last a few  minutes and then resolve.  This was followed by multiple episodes of nausea  and vomiting. She has had nausea and vomiting for the past two days.  She,  however, has continued to pass flatus, and she has continued to have soft  bowel movements.  She states that on Thursday, when her pain started, she  had loose watery bowel movement.  The patient was seen in the emergency room  where she was noted to have a diffusely tender abdomen.  CT scan of the  abdomen allegedly has shown small-bowel obstruction.  However, there is some  gas in the distal colon and rectum.  She is going to be admitted and  observed.  Further therapy will be based on her response to treatment.   PAST MEDICAL HISTORY:  She has no medical illness.  She takes no chronic  medications except for hormone replacement therapy.  She does not remember  the name of the medication.   PAST SURGICAL HISTORY:  1. Keloid removal from left ear as a child.  2. Abdominal myomectomy in 1995.  3. Total abdominal hysterectomy with bilateral  salpingo-oophorectomy 16 June 2003.   ALLERGIES:  None.   SOCIAL HISTORY:  She is married.  She is employed.  There is no history of  tobacco use, alcohol use, or drug use.   PHYSICAL EXAMINATION:  GENERAL:  The patient appears to be very anxious but  otherwise does not appear to be in any acute distress.  VITAL SIGNS:  Blood pressure 120/80, pulse 80, respirations 24, O2  saturation 99% on room air.  HEENT:  Unremarkable.  NECK:  Supple without JVD or bruit.  CHEST: Clear to auscultation.  HEART:  Regular rate and rhythm without murmur, gallop, or rub.  ABDOMEN:  Mildly distended, diffusely tender in hypogastrium with tenderness  with guarding in the right lower quadrant.  She has decreased  bowel sounds.  There is no clinical evidence of borborygmus.  She has a well-healed lower  midline abdominal scar with some early keloid formation.  EXTREMITIES:  No cyanosis, clubbing, or edema.  NEUROLOGIC:  No focal motor, sensory, or cerebellar deficit.   IMPRESSION:  Partial or complete intestinal obstruction status post total  abdominal hysterectomy with bilateral salpingo-oophorectomy.  The patient  had extensive lysis of adhesions and extension dissection of colon and small  bowel during her hysterectomy.  This may be a residual of that surgery.   PLAN:  1. The patient will be admitted.  2. She will be continued on IV fluids.  3. Nasogastric tube will be inserted and placed on moderate intermittent     suction  4. She will receive Phenergan 25 mg IV q.4h. p.r.n. for nausea and Dilaudid     2 mg IV q.2h. p.r.n. for pain.  5. I will repeat her abdominal series in the morning 1) to evaluate the     passage to contrast through her distal colon and small bowel and 2) to     look for the degree of distention of her small bowel.  If her small-bowel     distention has progressed and there has not been any passage of contrast     in to the distal bowel, she will probably need to have  exploratory     laparotomy at that time.  This has been discussed with the patient and     with her mother.  I will also discuss it with Dr. Emelda Fear.     ___________________________________________                                         Dirk Dress Katrinka Blazing, M.D.   LCS/MEDQ  D:  09/26/2003  T:  09/26/2003  Job:  [id number]   cc:   Tilda Burrow, M.D.  [address] [place]  [place] [address]  Fax: [phone]

## 2010-07-27 NOTE — H&P (Signed)
NAME:  [name], [name]                            ACCOUNT NO.:  [id number]   MEDICAL RECORD NO.:  [id number]                   PATIENT TYPE:  AMB   LOCATION:  DAY                                  FACILITY:  [facility]   PHYSICIAN:  Tilda Burrow, M.D.              DATE OF BIRTH:  1960/05/22   DATE OF ADMISSION:  06/16/2003  DATE OF DISCHARGE:                                HISTORY & PHYSICAL   ADMITTING DIAGNOSIS:  Symptomatic uterine fibroids, 20-22 weeks' size.   HISTORY OF PRESENT ILLNESS:  Glendene Wyer is a 50 year old African  American female, gravida 0, para 0, LMP October 29, 2002, with no birth  control methods.  She is status post laminectomy in 1995 and is evaluated in  our office as far back as September of last year, identifying recurrence of  large fibroids.  She describes her periods as heavy the first 2-3 days of  cycle without clotting, regular in timing, crampy but not exceedingly so.   She has an unexplained 2-cm-wide keloid on the left side of the sternum  which she wishes to have removed.   PAST MEDICAL HISTORY:  Medical history reported as negative.   SURGICAL HISTORY:  1. Keloid removed from ear as a child.  2. Abdominal myomectomy with light keloid formation, 1995.   ALLERGIES:  She has no allergies.   OBSTETRICAL HISTORY:  She has never been pregnant.   SOCIAL HISTORY:  Nonsmoker, nondrinker and no domestic violence issues.   LABORATORY EVALUATION:  Laboratory evaluation this year includes anemia  profile which shows B12 in normal range at 351 pg/mL, folic acid 12.6 ng/mL,  considered normal, with hemoglobin 12, hematocrit 40.2, with  normochromic/normocytic indices, platelets 313,000.   Ultrasound has shown that she has huge fibroids with no adnexal masses.  Pap  smear is Class I on November 29, 2002.   Mammogram was normal April 20, 2002.   PHYSICAL EXAMINATION:  GENERAL:  General exam shows a healthy, somewhat  anxious African American female.   Weight 175.  VITAL SIGNS:  Blood pressure 120/72.  Urinalysis:  Trace blood.  HEENT:  Pupils equal, round and reactive.  Extraocular movement intact.  NECK:  Neck supple.  Trachea midline.  BREASTS:  Exam deferred at this time.  ABDOMEN:  Abdomen shows a well-healed midline scar with lower abdominal  fullness and thickness.  EXTREMITIES:  Extremities grossly normal.  PELVIC:  External genitalia normal.  Vaginal exam:  Normal secretions.  Cervix nulliparous.  Uterus fills pelvis and is immobile, is wide lateral  and reaches to slightly above the umbilicus, estimated at 20-22 weeks.   IMPRESSION:  Symptomatic uterine fibroid, 20-22 weeks' size.   PLAN:  1. Hysterectomy with removal of cervix and preservation of ovaries (unless     abnormality is identified) on June 16, 2003 at 7:30 a.m.  2. Excision of keloid in chest.  The patient  is aware that there is a risk     of recurrence of the keloids and that no guarantees of effectiveness of     excision.  3. Lab work at 9 a.m., June 15, 2003; surgery, June 16, 2003, with GoLYTELY     bowel prep that evening.     ___________________________________________                                         Tilda Burrow, M.D.   JVF/MEDQ  D:  06/14/2003  T:  06/15/2003  Job:  [id number]

## 2010-07-27 NOTE — Discharge Summary (Signed)
NAMEEVERLEAN, BUCHER                            ACCOUNT NO.:  [id number]   MEDICAL RECORD NO.:  [id number]                   PATIENT TYPE:  INP   LOCATION:  IC04                                 FACILITY:  [facility]   PHYSICIAN:  Dirk Dress. Katrinka Blazing, M.D.                DATE OF BIRTH:  1960-10-12   DATE OF ADMISSION:  09/26/2003  DATE OF DISCHARGE:  10/04/2003                                 DISCHARGE SUMMARY   DISCHARGE DIAGNOSES:  1.  Small bowel obstruction due to extensive adhesions of terminal ileum.  2.  Acute septicemia with hypotension postoperatively.  3.  Disseminated intravascular coagulopathy.  4.  Acute tubular necrosis.  5.  Hyperkalemia.  6.  Metabolic acidosis.  7.  Shock liver syndrome.  8.  Anemia.   DISPOSITION:  The patient is transferred to [facility] under the care of Dr. Vira Browns a medical intensivist.  She was  stable at the time of transfer.   SUMMARY:  A 50 year old female who was admitted for evaluation of possible  intestinal obstruction.  She gives a history of having a hysterectomy of  June 16, 2003.  She did well in the postoperative period, and seemed to be  recovering uneventfully.  She developed right lower quadrant abdominal pain  four days prior to admission.  This became more severe.  This was followed  by multiple episodes of nausea and vomiting.  She had nausea and vomiting  for two days prior to admission.  She however, continued to pass flatus and  continued to have soft bowel movements.  She was seen in the emergency room  where she was noted to have a diffusely tender abdomen.  CT of the abdomen  was felt to be consistent with small bowel obstruction, though there was gas  in the distal colon and rectum.  She was admitted, treated and observed.  She had no other medical illnesses.  Her only surgeries were abdominal  myomectomy in 1995, and a total abdominal hysterectomy with bilateral  salpingo-oophorectomy April of  2005.   ALLERGIES:  She had no known drug allergies.   PHYSICAL EXAMINATION:  GENERAL:  On examination, she was very anxious.  VITAL SIGNS:  Blood pressure 120/80, pulse 80, respirations 24, oxygen  saturation was 99% on room air.  ABDOMEN:  Distended, diffusely tender in the hypogastrium with guarding in  right lower-quadrant and hypoactive bowel sounds.  She has well-healed,  lower mid-line scar with early keloid formation.  It was thought that she  had partial or complete intestinal obstruction due to previous surgery.  The  patient was admitted.  A nasogastric tube was inserted.  She was started on  IV fluids.  She was given Phenergan for nausea and Dilaudid for pain.  It  was felt that she would need to have exploratory laparotomy, but wished to  give her a trial  of decompression.  She continued to have bowel movements,  and her abdominal pain improved.  She did not show any abdominal distension,  so she was kept on nasogastric suction for two days.   On the 21st, she had three loose bowel movements, and had less abdominal  pain.  However, followup abdominal series showed persistent dilatation of  the small bowel with no gas in the colon or rectum.  She also had multiple  air-fluid levels.  She was scheduled for exploratory laparotomy.  This was  done on July 22.  At the time of laparotomy, she was found to have a tight  band across multiple loops of terminal ileum causing near-complete  obstruction.  The extensive adhesions involved most of the surface of the  distal one-fourth of the terminal ileum with multiple areas of near-complete  obstruction.  Simple adhesiolysis was done, and the bowel was decompressed  by pushing the fluid and gas back into the proximate jejunum since a  nasogastric tube had worked its way down into the duodenum.  The patient was  stable for the 24 hours, but on the early morning of the 24th, she became  restless, clammy, and blood pressure dropped to 60.   She was noted to be  hypotensive and had oxygen desaturation.  She denied increased abdominal  pain.  Urine output was noted to be down.  She was given two liters of  normal saline and 500 cc of Hespan and two units of packed red blood cells  after it was noted that her hemoglobin had dropped from 10.1 to 8.7.  She  responded to treatment with a blood pressure increased above 90 and a pulse  of 105.  She was then started on dopamine at 10 micrograms per kg per  minute.  Her blood pressure increased to 130/90.  Her O2 saturation was 90%  on four liters, so she was switched to 100%  nonrebreather mask.  It was  felt that she had large-volume, third space volume loss with intravascular  contraction and hypotension with possible sepsis though her temperature  remained normal, and her white count was less than 10,000.  It was noted  however that her platelets had decreased to 51,000.  Antibiotics were  switched to Zosyn and Flagyl with a single dose of gentamicin.  Later that  day, she felt better.  She was fully oriented.  Blood pressure was normal.  Pulse was 103.  Temperature was 98.3.  She had diffuse swelling about  compatible with her third space loss.  Her white count, however, was only  7100, platelets had upped to 39,000.  Serum potassium was noted to be 5.4,  BUN was 13, creatinine 1.9.  Serum calcium was 6.1.  Her PT was 37.6,  fibrinogen was 187, and D-Dimer was greater than 20.  It was felt that she  had a severe hypoperfusion state with hypotension and hypoxemia with  metabolic acidosis.  She was also noted to have elevated liver function  studies thought to be compatible with a shock liver syndrome.   By the evening of the 24th, she was more stable.  Blood pressure was 110/80  off dopamine.  The pH was 7.23.  Serum potassium increased to 6.  BUN and  creatinine started to rise.  Nephrology consultation was obtained.  It was felt that she had acute renal failure with DIC and  shock liver.  GI  consultation was obtained, and it was his impression that she had  a history  compatible with ischemic hepatitis or shock liver syndrome.  Her DIC  appeared to be worse.  It was felt that she would probably need hemodialysis  because of worsening renal failure.  BUN increased to 43, creatinine  increased to 5.1.  Interestingly enough, her white count was only 6500.  Her  acidosis persisted.  It was felt that the patient had significantly-elevated  clotting status with decreased platelets and that she would need component  therapy as well as central line placement.  There was extreme difficulty in  getting any of these components, and it was felt that it would be best to  transfer her to [facility].  This was discussed with  her husband, her father, and her sisters.  Transfer was arranged, and she  was transferred to [facility] under the care of Dr. Vira Browns at the  Medical Intensive Care Unit.  Vital sign were stable at the time of  transfer.     ___________________________________________                                         Dirk Dress. Katrinka Blazing, M.D.   LCS/MEDQ  D:  11/20/2003  T:  11/21/2003  Job:  [id number]

## 2010-07-27 NOTE — Consult Note (Signed)
NAME:  [name], [name]                            ACCOUNT NO.:  [id number]   MEDICAL RECORD NO.:  [id number]                   PATIENT TYPE:  INP   LOCATION:  IC04                                 FACILITY:  [facility]   PHYSICIAN:  Jorja Loa, M.D.             DATE OF BIRTH:  [birth year]   DATE OF CONSULTATION:  DATE OF DISCHARGE:                                   CONSULTATION   REASON FOR CONSULTATION:  Worsening of renal failure.   HISTORY:  [name] is a 50 year old African American without a significant  past medical history except a history of abdominal myomectomy and a recent  hysterectomy on June 16, 2003.  She presently came with abdominal pain and  also distention.  After admission the possibility of small intestinal  obstruction was entertained and the patient had abdominal surgery with lysis  of adhesions.  Presently a consult was called because of her decrease  urinary output and elevated BUN, creatinine, and hyperkalemia.   PAST MEDICAL HISTORY:  1. Hysterectomy in April 2005.  2. History of abdominal muscle myectomy.  3. History of surgery for ____________.  4. Surgery for laminectomy.   ALLERGIES:  No known allergies.   CURRENT MEDICATIONS:  1. Xanax 0.25 mg every 12 hours.  2. Calcium 2000 mg IV q.24h.  3. Lasix.  4. Dilaudid.  5. Solu-Medrol 250 mg q.6h.  6. Reglan 10 mg q.6h.  7. Flagyl 500 mg IV.  8. Zofran.  9. Vitamin K 4.5 mg q.8h.  10.      Dopamine.  11.      Phenergan.  12.      Gentamicin 240 mg IV ____________.   SOCIAL HISTORY:  No history of smoking or history of alcohol abuse.   REVIEW OF SYSTEMS:  The patient at this moment denies any shortness of  breath.  She feels okay.  She complains of no energy.  Otherwise feels okay.   PHYSICAL EXAMINATION:  VITAL SIGNS:  Blood pressure is 120/70.  She is  afebrile.  HEENT:  She has facial swelling, icterus of the eyes.  Also swelling of her  eyelids.  CHEST:  Bilaterally decreased breath sounds.   Also some inspiratory  crackles.  HEART:  Reveals a regular rate and rhythm.  No murmurs.  ABDOMEN:  Full but no significant distention.  However, her abdominal sounds  are not heard.  EXTREMITIES:  She has trace to 1+ edema.   LABORATORY DATA:  Her 24 hour input is about 8000.  She has about roughly 12  cc of urine.  Overall she is positive for about 7 L.  Her BUN is 25,  creatinine 3.4, potassium 6, sodium 142, a CO2 of 21.  Her PT is 27.5.  INR  of 3.7.  Fibrinogen 195 and low.  D-dimer more than 20, that seems to be  high.  Bilirubin is 2.8, total direct bilirubin 1.5,  indirect bilirubin 1.3.  SGOT 3815, SGPT 1395, total albumin 2.5, and her calcium is 7.8.  UA  specific gravity from yesterday is 1.025 and she has ketones.  She has large  blood protein greater than 300.  She positive for glucose and she has some  casts.  Her white blood cell count is 5.7, hemoglobin 9.2, hematocrit 26.7,  platelets 456.  Her pH from today is 7.257, a PCO2 of 42, and O2 saturation  is 178.   ASSESSMENT:  1. Renal insufficiency.  In this woman it seems to be acute.  Her blood urea     nitrogen and creatinine on July 23 was 10 and 1, then increased to 14 and     2.3.  It remains stable.  However, this morning it went up to 25 and 3.4.     The etiology for her renal insufficiency may be secondary to acute     tubular necrosis related to sepsis, hypertension, plus also gentamicin.     However, since her urine for specific gravity is very high I cannot rule     out a previous syndrome secondary to intravascular volume depletion.  At     this moment, the patient seems to be in positive fluid balance.  2. Anemia.  Her H&H seems to be decreasing.  I am not whether this is a part     of the septic syndrome, as the patient has low platelets, IE, from     cytopenia.  However, her white blood cell count remains stable.  3. Septic shock.  She is on dopamine.  Blood pressure is reasonable.  Urine     output is  very low.  Her liver function is also very high.  At the moment     the patient also has an elevated prothrombin time and iron now with low     fibrinogen D-dimer, possibly in DIC (disseminated intravascular     coagulation).  She is on multiple antibiotics.  4. Elevated liver function tests, very high, possibly from shocked liver     from sepsis and hypertension.  5. Hypokalemia possibly related to her renal insufficiency.  6. Hyperalbuminemia and malnutrition plus liver disease.   RECOMMENDATIONS:  We will start her on Lasix drip at 1/50 mg and 250 cc at  25 cc/hour.  I have discussed this with Dr. Katrinka Blazing.  He feels that it is okay  to probably use Kayexalate rectally.  At this moment we will try to do that.  Hopefully that will give [place] time to see if her renal function will recover.  We will give her 60 g of Kayexalate now and we will repeat it after 6 hours.  We will repeat her B-MET at 6 p.m. and we will follow the patient.  We will  do an ultrasound of the kidneys to rule out an obstruction, as the patient  has proteinuria, which usually is not common in prerenal syndrome.  After  general condition stabilizes, we probably will do a 24 hour urine for  creatinine clearance and see if she has underlying nephrotic syndrome.  We  will continue with other medication.  We will follow the patient with you.      ___________________________________________                                            Jorja Loa,  M.D.   BB/MEDQ  D:  10/03/2003  T:  10/03/2003  Job:  [id number]

## 2010-07-27 NOTE — Op Note (Signed)
NAME:  [name], [name]                            ACCOUNT NO.:  [id number]   MEDICAL RECORD NO.:  [id number]                   PATIENT TYPE:  INP   LOCATION:  A336                                 FACILITY:  [facility]   PHYSICIAN:  Dirk Dress. Katrinka Blazing, M.D.                DATE OF BIRTH:  Aug 12, 1960   DATE OF PROCEDURE:  DATE OF DISCHARGE:                                 OPERATIVE REPORT   PREOPERATIVE DIAGNOSIS:  Small-bowel obstruction.   POSTOPERATIVE DIAGNOSIS:  Small-bowel obstruction due to extensive adhesions  of terminal ileum.   PROCEDURE:  Exploratory laparotomy with adhesiolysis.   SURGEON:  Dirk Dress. Katrinka Blazing, M.D.   DESCRIPTION:  Under general endotracheal anesthesia  the patient's abdomen  was prepped and draped in a sterile field.  The old midline incision scar  was excised.  The incision was extended through the fascia to the peritoneal  cavity.  Upon entering the perineal cavity a large volume of serous  peritoneal fluid was encountered.  This was suctioned. The incision was  extended above the umbilicus because of inability to mobilize the dilated  bowel through the infraumbilical incision.   Once the peritoneal cavity was adequately opened the small bowel was  inspected and it was dilated proximal all the way to the ligament of Treitz.  Distally there was a tight band across 2 loops of terminal ileum and about  the distal third of the ileum.  This band caused near complete obstruction  of the antimesenteric border of the bowel.  This appeared to be the area of  major concern.  This bowel was lysed and immediately the intestines distal  to this band dilated.  The distal one-fourth of the terminal ileum had  extensive adhesions which were taken down sharply using the Metzenbaum  scissors.  Once this was done, the intestines could be evaluated from the  ileocecal valve up to the ligament of Treitz.  There were 2 areas of tight  scar, but most of the scar appeared to be in the  anterior mesenteric border.  It was felt that they were not likely to cause obstruction at this time.   The nasogastric tube had inadvertently been introduced down into the  proximal jejunum so it was possible to manually decompress the bowel  significantly by compressing the gas and liquid in the small bowel back ot  the proximal jejunum.  Once this was done the small bowel was easily  returned to the peritoneal cavity.  Inspection for other areas of adhesions  were not apparent.  The fascia was closed after verifying sponge, needle,  and instrument counts were correct.  The fascia was closed with running #1  Prolene.  The subcutaneous tissue was closed with 2-0 Biosyn or Monocryl.  The skin was closed with subcuticular 2-0 chromic.  This was done to try to  prevent major  keloid formation.  Steri-Strips were  placed and the wound was covered with  OpSite.  The patient tolerated the procedure well.  She was awakened from  anesthesia uneventfully, transferred to a bed, and taken to the  postanesthetic care unit in satisfactory condition.      ___________________________________________                                            Dirk Dress Katrinka Blazing, M.D.   LCS/MEDQ  D:  09/30/2003  T:  09/30/2003  Job:  [id number]   cc:   Tilda Burrow, M.D.  [address] [place]  [place] [address]  Fax: [phone]

## 2010-07-27 NOTE — Op Note (Signed)
NAMETEJA, COSTEN                  ACCOUNT NO.:  [id number]   MEDICAL RECORD NO.:  [id number]          PATIENT TYPE:  INP   LOCATION:  A322                          FACILITY:  [facility]   PHYSICIAN:  Jerolyn Shin C. Katrinka Blazing, M.D.   DATE OF BIRTH:  October 19, 1960   DATE OF PROCEDURE:  DATE OF DISCHARGE:                                 OPERATIVE REPORT   PREOPERATIVE DIAGNOSIS:  Small bowel obstruction.   POSTOPERATIVE DIAGNOSIS:  Small bowel obstruction due to extensive  adhesions.   PROCEDURE:  Exploratory laparotomy with extensive small bowel adhesiolysis.   SURGEON:  Dirk Dress. Katrinka Blazing, M.D.   DESCRIPTION:  Under general anesthesia, the patient's abdomen was prepped  and draped in the sterile field.  The old keloid incision was excised.  The  subcutaneous tissue was dissected down to the fascia.  Old suture was  removed.  The peritoneal cavity was entered in the supraumbilical midline.  There was some omentum adherent to the anterior abdominal wall, and this was  bluntly dissected with finger dissection.  The peritoneal cavity was then  entered.  Omentum to the lateral peritoneum was excised.  The patient had  multiple convoluted loops of decompressed small bowel in the midpelvis.  In  the upper abdomen there were dilated loops of small bowel.  The area of  small bowel that was in the midportion of the incision was dissected.  There  were extensive adhesions.  A full dissection of the major portion of all of  the small bowel had to be carried out.  There was a segment of small bowel  that was tightly compressed such as in an internal hernia created by omentum  down to the base of the mesentery.  This was some of the omentum coming off  the transverse colon in the midportion.  This tight band was dissected,  doubly clipped and divided.  Was tied with ligatures of 3-0 Vicryl.  There  was another extremely tight band with acute angulation in the midportion of  the jejunum.  This was dissected and the  bowel immediately distal to the  point of obstruction was partially decompressed.  The bowel in the area  immediately proximal to the ileocecal valve was decompressed.  This was  serially dissected until all of the bowel was freed up, extending down to  the ileocecal valve.  Since the major portion of the bowel was involved, it  was felt that resection of any portion of the bowel would not prevent her  from having a similar episode in the future, so no bowel was resected.  There was no bowel that appeared to be compromised.  Once the adhesions were  dissected, all of the bowel distended appropriately.  The bowel was followed  from ileocecal valve to ligament of Treitz.  There were no residual  adhesions.  The nasogastric tube was positioned in the stomach.  The liver  appeared to be unharmed.  The colon was decompressed and was soft and  pliable.  Rectum appeared to be normal.  The peritoneal cavity was irrigated  with saline  and the bowel was placed back into the peritoneal cavity.  Omentum was laid over the small bowel.  The abdomen was closed using running  #1 Prolene on the fascia, 2-0 Vicryl in the subcutaneous tissue and 2-0  chromic on the skin in a subcuticular pattern.  This was done to try to  prevent major keloid formation or to reduce the volume  of the keloids.  A sterile dressing was placed.  The patient tolerated the  procedure well.  She was awakened from anesthesia without difficulty.  She  was transferred to a bed and taken to the postanesthetic care unit for  further monitoring.      LCS/MEDQ  D:  06/12/2004  T:  06/12/2004  Job:  [id number]

## 2010-07-27 NOTE — H&P (Signed)
[name], [name]                  ACCOUNT NO.:  [id number]   MEDICAL RECORD NO.:  [id number]          PATIENT TYPE:  INP   LOCATION:  A322                          FACILITY:  [facility]   PHYSICIAN:  Jerolyn [name], M.D.   DATE OF BIRTH:  [birth year]   DATE OF ADMISSION:  06/04/2004  DATE OF DISCHARGE:  LH                                HISTORY & PHYSICAL   This is a 50 year old female admitted with a complaint of severe  constipation.  The patient states that she has had increasing constipation  over the past week.  Stools have become increasingly harder and more  difficult to pass.  She started having nausea and vomiting three days prior  to admission.  She states that her emesis is mostly brown liquid, which is  bitter.  Symptoms actually started on March 23.  It became progressively  more severe, and she was seen in the emergency room on March 25.  At that  time, they gave her some IV fluids and medications for nausea but did not  take any x-rays or do any lab work.  She was sent home, but after she  arrived home, she continued to have nausea with vomiting.  She was seen in  the office, where she appeared to be in moderate-to-severe distress.  She  states that she was not having pain and in fact, not having any discomfort  in her abdomen at all.  She simple complained of a bloating feeling and an  inability to pass gas or have a bowel movement.  Clinical exam suggests that  she has partial bowel obstruction.  She is admitted for treatment.  Significant history is that she was admitted in July, 2005 with a clinical  bowel obstruction.  She had a CT of the abdomen, felt to be consistent with  small bowel obstruction.  She underwent exploratory laparotomy on July 22.  At that time of her laparotomy, she was found to have a tight band across  multiple loops of terminal ileum, causing complete obstruction.  She had  extensive adhesions involving most of the surface of the distal fourth of  the terminal ileum with multiple areas of near-complete obstruction.  A  adhesiolysis was done, and the bowel was decompressed.  The patient was  stable 24 hours, and then she became hypotensive, hypoxic, and she did not  respond to fluid resuscitation.  She finally had to be started on dopamine,  and she responded thereafter.  She had a picture of disseminated  intravascular coagulopathy.  She also had a shock liver, severe metabolic  acidosis, and acute renal failure.  Her DIC became worse, and her renal  failure became progressively worse.  She had persistent acidosis.  Patient  was finally transferred to [facility] with disseminated intravascular  coagulopathy, acute tubular necrosis with renal failure, shock liver  syndrome, anemia, and hyperkalemia.  She was treated at [facility] medically.  She was dialyzed.  She improved and was finally discharged from [facility] on  August 20th.  The etiology of her acute sepsis was never discovered.  Her  renal failure had resolved, and the patient has been followed up in the  office.  Was seen in the office on February 20th.  She was doing well.  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  She was having good bowel movements.  It was  felt that she had recovered from her multisystem organ failure.  Her last  hemoglobin was 11.5, hematocrit 33.9, BUN 23, creatinine 1.2, potassium 4.  This was done on February 20th.  Other significant history is that her  problems started in April, 2005 when she underwent a hysterectomy.  She had  extensive adhesiolysis during the time of the hysterectomy.  There is a  history of abdominal myomectomy in 1995.   PRESENT MEDICATIONS:  1.  Premarin 1.25 mg daily.  2.  Hemocyte Plus 1 daily.   ALLERGIES:  None.   PHYSICAL EXAMINATION:  VITAL SIGNS:  Blood pressure 130/90, pulse 68,  respirations 20, temperature 98.7.  Weight 171 pounds.  GENERAL:  She is a pleasant female in mild acute distress, due to nausea.  She  appears anxious.  HEENT:  Unremarkable.  NECK:  Supple except for healed keloids of the areas of her central venous  catheters.  Neck is otherwise unremarkable.  There is no adenopathy.  LUNGS:  Clear to auscultation.  HEART:  Regular rate and rhythm without murmur, rub or gallop.  ABDOMEN:  Soft.  Mildly distended with more distention in the lower abdomen.  She has thickened lower midline abdominal incision with keloid formation.  She does not have significant tenderness.  She has hypoactive bowel sounds.  EXTREMITIES:  No clubbing, cyanosis or edema.  No joint deformity.  Pulses  are intact.  NEUROLOGIC:  No focal motor, sensory, or cerebellar deficit.   IMPRESSION:  1.  Recurrent nausea and vomiting with constipation, most likely developing      small bowel obstruction.  2.  Anemia.  3.  History of acute-on-chronic renal failure.  4.  Postmenopausal syndrome.   PLAN:  The patient is admitted.  Will get a CT scan of her abdomen.  Repeat  plain films.  She will receive IV fluids.  We will get baseline labs.  Based  on these clinical findings, she probably is going to need operative therapy.  It has been decided previously that if operative therapy is needed, we will  send her back to [facility], since it is unknown as to what caused her initial  multiorgan system failure after the previous operation, and it would be best  that she be cared for in the facility where she can have multiple  disciplines if she should develop a similar clinical picture.  This has been  discussed with the patient and her husband.  They are agreeable.      LCS/MEDQ  D:  06/05/2004  T:  06/05/2004  Job:  [id number]

## 2010-07-27 NOTE — Discharge Summary (Signed)
[name], [name]                            ACCOUNT NO.:  [id number]   MEDICAL RECORD NO.:  [id number]                   PATIENT TYPE:  INP   LOCATION:  A408                                 FACILITY:  [facility]   PHYSICIAN:  Tilda Burrow, M.D.              DATE OF BIRTH:  Jun 01, 1960   DATE OF ADMISSION:  06/16/2003  DATE OF DISCHARGE:  06/19/2003                                 DISCHARGE SUMMARY   ADMISSION DIAGNOSES:  1. Symptomatic uterine fibroids, 20 to 22 weeks size.  2. Chest wall keloids.   DISCHARGE DIAGNOSIS:  Symptomatic uterine fibroids, 20 to 22 weeks size,  removed.   PROCEDURE:  Total abdominal hysterectomy, bilateral salpingo-oophorectomy,  appendectomy, excision of keloid to the chest wall.   HISTORY OF PRESENT ILLNESS:  This 50 year old nulliparous African American  female was admitted for hysterectomy for symptomatic uterine fibroids,  estimated at 14, at 20-22 weeks size.  She had an explained 2 cm keloid on  the left side of her sternum which she wanted removed as well.   SURGICAL HISTORY:  Showed a keloid removal from the ear as a childhood that  was successful, and myomectomy in 1995.   HOSPITAL COURSE:  The patient was admitted with hemoglobin of 12, hematocrit  40.2, platelets 313,000.  Ultrasound showed fibroids and Pap smear is class  1.  She underwent hysterectomy on June 16, 2003.  There was 1500 cc of blood  loss, and two units of packed cells were required.  She had extensive  adhesions of the sigmoid to the apex of the uterus as well as extensive  pelvic adhesions to the uterus with parasitic blood supply at multiple  sites.   The patient's postoperative course was stable.  She had generous fluid  hydration and good urinary output.  Hemoglobin was 12 and 34 after the two  units of blood.  She was stable for discharge on the third postoperative  day, tolerating a regular diet with the patient to stay of iron and  vitamins.   FOLLOWUP:  In one  week in our office.     ___________________________________________                                         Tilda Burrow, M.D.   JVF/MEDQ  D:  07/13/2003  T:  07/14/2003  Job:  [id number]

## 2013-01-15 ENCOUNTER — Encounter (HOSPITAL_COMMUNITY): Payer: Self-pay | Admitting: Emergency Medicine

## 2013-01-15 ENCOUNTER — Emergency Department (HOSPITAL_COMMUNITY)
Admission: EM | Admit: 2013-01-15 | Discharge: 2013-01-15 | Disposition: A | Discharge status: Home / Self Care | Payer: 59 | Attending: Emergency Medicine | Admitting: Emergency Medicine

## 2013-01-15 DIAGNOSIS — Z87448 Personal history of other diseases of urinary system: Secondary | ICD-10-CM | POA: Insufficient documentation

## 2013-01-15 DIAGNOSIS — E785 Hyperlipidemia, unspecified: Secondary | ICD-10-CM | POA: Insufficient documentation

## 2013-01-15 DIAGNOSIS — Z79899 Other long term (current) drug therapy: Secondary | ICD-10-CM | POA: Insufficient documentation

## 2013-01-15 DIAGNOSIS — Z862 Personal history of diseases of the blood and blood-forming organs and certain disorders involving the immune mechanism: Secondary | ICD-10-CM | POA: Insufficient documentation

## 2013-01-15 DIAGNOSIS — L0390 Cellulitis, unspecified: Secondary | ICD-10-CM

## 2013-01-15 DIAGNOSIS — L910 Hypertrophic scar: Secondary | ICD-10-CM | POA: Insufficient documentation

## 2013-01-15 DIAGNOSIS — L02219 Cutaneous abscess of trunk, unspecified: Secondary | ICD-10-CM | POA: Insufficient documentation

## 2013-01-15 MED ORDER — HYDROCODONE-ACETAMINOPHEN 5-325 MG PO TABS
1.0000 | ORAL_TABLET | Freq: Once | ORAL | Status: AC
  Administered 2013-01-15: 1 via ORAL
  Filled 2013-01-15: qty 1

## 2013-01-15 MED ORDER — SULFAMETHOXAZOLE-TMP DS 800-160 MG PO TABS
1.0000 | ORAL_TABLET | Freq: Once | ORAL | Status: AC
  Administered 2013-01-15: 1 via ORAL
  Filled 2013-01-15: qty 1

## 2013-01-15 MED ORDER — HYDROCODONE-ACETAMINOPHEN 5-325 MG PO TABS: 1.0000 | ORAL_TABLET | ORAL | Status: DC | PRN

## 2013-01-15 MED ORDER — SULFAMETHOXAZOLE-TMP DS 800-160 MG PO TABS: 1.0000 | ORAL_TABLET | Freq: Two times a day (BID) | ORAL | Status: DC

## 2013-01-15 NOTE — ED Notes (Signed)
Has keloid on chest that began to hurt  And pt says is swollen.

## 2013-01-16 NOTE — ED Provider Notes (Signed)
CSN: [id number]     Arrival date & time 01/15/13  1741 History   First MD Initiated Contact with Patient 01/15/13 1916     Chief Complaint  Patient presents with  . Skin Problem   (Consider location/radiation/quality/duration/timing/severity/associated sxs/prior Treatment) HPI Comments: [name] is a 52 y.o. Female presenting with a several day history of  pain and increased swelling of a chronic keloid midline between the upper edge of her breasts which worsened this evening.  She has multiple keloids on her chest and left neck from prior IV's and having a port placed when she was hospitalized for acute renal failure years ago, but the problematic keloid developed from a simple abrasion to her chest wall.  There has been no drainage from the site and there is no radiation of pain. Palpation worsens her pain and she has found no alleviators.  She has been afebrile and denies nausea, vomiting or other complaint.     The history is provided by the patient and the spouse.    Past Medical History  Diagnosis Date  . Anemia     NOS   . Hyperlipidemia   . Renal failure    Past Surgical History  Procedure Laterality Date  . Hysterectomy    . Abdominal hysterectomy     History reviewed. No pertinent family history. History  Substance Use Topics  . Smoking status: Never Smoker   . Smokeless tobacco: Not on file  . Alcohol Use: No   OB History   Grav Para Term Preterm Abortions TAB SAB Ect Mult Living                 Review of Systems  Constitutional: Negative for fever and chills.  HENT: Negative.   Eyes: Negative.   Respiratory: Negative for chest tightness and shortness of breath.   Cardiovascular: Positive for chest pain.  Gastrointestinal: Negative for nausea and abdominal pain.  Genitourinary: Negative.   Musculoskeletal: Negative for arthralgias, joint swelling and neck pain.  Skin: Positive for color change. Negative for rash.  Neurological: Negative for dizziness,  weakness, light-headedness, numbness and headaches.  Psychiatric/Behavioral: Negative.     Allergies  Review of patient's allergies indicates no known allergies.  Home Medications   Current Outpatient Rx  Name  Route  Sig  Dispense  Refill  . Multiple Vitamin (MULTIVITAMINS PO)   Oral   Take 1 tablet by mouth daily.          Marland Kitchen HYDROcodone-acetaminophen (NORCO/VICODIN) 5-325 MG per tablet   Oral   Take 1 tablet by mouth every 4 (four) hours as needed for moderate pain.   20 tablet   0   . sulfamethoxazole-trimethoprim (BACTRIM DS) 800-160 MG per tablet   Oral   Take 1 tablet by mouth 2 (two) times daily.   20 tablet   0    BP 130/87  Pulse 70  Temp(Src) 98.1 F (36.7 C) (Oral)  Resp 20  Ht 5\' 4"  (1.626 m)  Wt 198 lb (89.812 kg)  BMI 33.97 kg/m2  SpO2 99% Physical Exam  Constitutional: She appears well-developed and well-nourished. No distress.  HENT:  Head: Normocephalic.  Neck: Neck supple.  Cardiovascular: Normal rate and regular rhythm.   Pulmonary/Chest: Effort normal.  Musculoskeletal: Normal range of motion. She exhibits no edema.  Skin:  Large raised irregular keloid between breasts with erythema and warmth, no surrounding erythema.  She has 2 small keloids left neck,  Flatter, nontender large keloid upper chest wall.  ED Course  Procedures (including critical care time) Labs Review Labs Reviewed - No data to display Imaging Review No results found.  EKG Interpretation   None       MDM   1. Keloid   2. Cellulitis    Pt also seen by Dr. Estell Harpin.  Call placed to Dr. Lovell Sheehan, spoke with him about  f/u care. Will start patient on bactrim, hydrocodone prescribed for pain relief.  Encouraged warm compresses.  She will see Dr Lovell Sheehan as outpatient on Monday.  Encouraged recheck here sooner if her sx worsen.     Burgess Amor, PA-C 01/16/13 0028

## 2013-01-17 NOTE — ED Provider Notes (Signed)
Medical screening examination/treatment/procedure(s) were performed by non-physician practitioner and as supervising physician I was immediately available for consultation/collaboration.  EKG Interpretation   None         Anabella Capshaw L Rockey Guarino, MD 01/17/13 1909 

## 2014-10-01 ENCOUNTER — Emergency Department (HOSPITAL_COMMUNITY)
Admission: EM | Admit: 2014-10-01 | Discharge: 2014-10-01 | Disposition: A | Discharge status: Home / Self Care | Payer: 59 | Attending: Emergency Medicine | Admitting: Emergency Medicine

## 2014-10-01 ENCOUNTER — Encounter (HOSPITAL_COMMUNITY): Payer: Self-pay | Admitting: Emergency Medicine

## 2014-10-01 DIAGNOSIS — Z79899 Other long term (current) drug therapy: Secondary | ICD-10-CM | POA: Insufficient documentation

## 2014-10-01 DIAGNOSIS — K047 Periapical abscess without sinus: Secondary | ICD-10-CM | POA: Diagnosis not present

## 2014-10-01 DIAGNOSIS — Z862 Personal history of diseases of the blood and blood-forming organs and certain disorders involving the immune mechanism: Secondary | ICD-10-CM | POA: Diagnosis not present

## 2014-10-01 DIAGNOSIS — H9202 Otalgia, left ear: Secondary | ICD-10-CM | POA: Insufficient documentation

## 2014-10-01 DIAGNOSIS — Z87448 Personal history of other diseases of urinary system: Secondary | ICD-10-CM | POA: Diagnosis not present

## 2014-10-01 DIAGNOSIS — R600 Localized edema: Secondary | ICD-10-CM | POA: Diagnosis present

## 2014-10-01 DIAGNOSIS — Z8639 Personal history of other endocrine, nutritional and metabolic disease: Secondary | ICD-10-CM | POA: Diagnosis not present

## 2014-10-01 DIAGNOSIS — Z792 Long term (current) use of antibiotics: Secondary | ICD-10-CM | POA: Diagnosis not present

## 2014-10-01 MED ORDER — PENICILLIN V POTASSIUM 500 MG PO TABS: 500.0000 mg | ORAL_TABLET | Freq: Four times a day (QID) | ORAL | Status: AC

## 2014-10-01 MED ORDER — CEFTRIAXONE SODIUM 1 G IJ SOLR
1.0000 g | Freq: Once | INTRAMUSCULAR | Status: AC
  Administered 2014-10-01: 1 g via INTRAMUSCULAR
  Filled 2014-10-01: qty 10

## 2014-10-01 MED ORDER — HYDROCODONE-ACETAMINOPHEN 5-325 MG PO TABS: 1.0000 | ORAL_TABLET | Freq: Four times a day (QID) | ORAL | Status: DC | PRN

## 2014-10-01 NOTE — ED Provider Notes (Signed)
CSN: [id number]     Arrival date & time 10/01/14  7829 History  This chart was scribed for Loretta Berkshire, MD by Murriel Hopper, ED Scribe. This patient was seen in room APA08/APA08 and the patient's care was started at 9:06 AM.    Chief Complaint  Patient presents with  . Oral Swelling     Patient is a 54 y.o. female presenting with tooth pain. The history is provided by the patient. No language interpreter was used.  Dental Pain Location:  Lower Quality:  Dull Severity:  Moderate Onset quality:  Gradual Duration:  4 days Timing:  Constant Chronicity:  New Context: abscess   Relieved by:  Nothing Ineffective treatments:  NSAIDs Associated symptoms: facial swelling and fever      HPI Comments: [name] is a 54 y.o. female who presents to the Emergency Department complaining of constant, worsening left-sided facial swelling with associated dental pain, ear pain, and fever that has been present for 4 days. Pt states she took Motrin this morning with no relief, and notes that she called the dentist but could not get an appointment for another two days. Pt notes she wanted an appointment yesterday or today but they were not open, and states she is in so much pain that she cannot wait another two days without relief. Pt denies history of similar issues in the past.     Past Medical History  Diagnosis Date  . Anemia     NOS   . Hyperlipidemia   . Renal failure    Past Surgical History  Procedure Laterality Date  . Hysterectomy    . Abdominal hysterectomy     Family History  Problem Relation Age of Onset  . COPD Mother   . Emphysema Mother    History  Substance Use Topics  . Smoking status: Never Smoker   . Smokeless tobacco: Never Used  . Alcohol Use: No   OB History    Gravida Para Term Preterm AB TAB SAB Ectopic Multiple Living   0 0 0 0 0 0 0 0 0 0      Review of Systems  Constitutional: Positive for fever.  HENT: Positive for dental problem, ear pain and  facial swelling.   All other systems reviewed and are negative.     Allergies  Review of patient's allergies indicates no known allergies.  Home Medications   Prior to Admission medications   Medication Sig Start Date End Date Taking? Authorizing Provider  HYDROcodone-acetaminophen (NORCO/VICODIN) 5-325 MG per tablet Take 1 tablet by mouth every 4 (four) hours as needed for moderate pain. 01/15/13   Burgess Amor, PA-C  Multiple Vitamin (MULTIVITAMINS PO) Take 1 tablet by mouth daily.     Historical Provider, MD  sulfamethoxazole-trimethoprim (BACTRIM DS) 800-160 MG per tablet Take 1 tablet by mouth 2 (two) times daily. 01/15/13   Burgess Amor, PA-C   BP 126/92 mmHg  Pulse 70  Temp(Src) 97.8 F (36.6 C) (Oral)  Resp 18  Ht 5\' 3"  (1.6 m)  Wt 185 lb (83.915 kg)  BMI 32.78 kg/m2  SpO2 100% Physical Exam  Constitutional: She is oriented to person, place, and time. She appears well-developed.  HENT:  Head: Normocephalic.  Swelling and tenderness to lower left cheek Tenderness to one of her left lower molars  Eyes: Conjunctivae and EOM are normal. No scleral icterus.  Neck: Neck supple. No thyromegaly present.  Cardiovascular: Normal rate and regular rhythm.  Exam reveals no gallop and no friction rub.  No murmur heard. Pulmonary/Chest: No stridor. She has no wheezes. She has no rales. She exhibits no tenderness.  Abdominal: She exhibits no distension. There is no tenderness. There is no rebound.  Musculoskeletal: Normal range of motion. She exhibits no edema.  Lymphadenopathy:    She has no cervical adenopathy.  Neurological: She is oriented to person, place, and time. She exhibits normal muscle tone. Coordination normal.  Skin: No rash noted. No erythema.  Psychiatric: She has a normal mood and affect. Her behavior is normal.    ED Course  Procedures (including critical care time)  DIAGNOSTIC STUDIES: Oxygen Saturation is 100% on room air, normal by my interpretation.     COORDINATION OF CARE: 9:09 AM Discussed treatment plan with pt at bedside and pt agreed to plan.   Labs Review Labs Reviewed - No data to display  Imaging Review No results found.   EKG Interpretation None      MDM   Final diagnoses:  None   Abscess tooth.  rx pen and vicodin.  Pt to follow up with dentist Monday  The chart was scribed for me under my direct supervision.  I personally performed the history, physical, and medical decision making and all procedures in the evaluation of this patient.Loretta Berkshire, MD 10/01/14 0930

## 2014-10-01 NOTE — Discharge Instructions (Signed)
Follow up with your dentist Monday as planned.

## 2014-10-01 NOTE — ED Notes (Addendum)
Patient has swelling to left jaw. Per patient she woke with headache, fever, dental pain , ear ache, and swelling to left jaw Wednesday afternoon, in which she took tylenol and advil. Per patient no headache, fever, or earache since but swelling has increased in swelling with slight toothache. Patient took Motrin this morning for toothache and denies any pain at this time. Patient denies any difficulty swallowing or breathing. Per patient unable to get dental appointment until Monday.

## 2019-03-01 ENCOUNTER — Other Ambulatory Visit: Payer: Self-pay

## 2019-03-01 ENCOUNTER — Ambulatory Visit: Payer: 59 | Attending: Internal Medicine

## 2019-03-01 DIAGNOSIS — Z20822 Contact with and (suspected) exposure to covid-19: Secondary | ICD-10-CM

## 2019-03-02 LAB — NOVEL CORONAVIRUS, NAA: SARS-CoV-2, NAA: NOT DETECTED

## 2019-06-04 ENCOUNTER — Ambulatory Visit: Payer: 59 | Attending: Internal Medicine

## 2019-06-04 DIAGNOSIS — Z23 Encounter for immunization: Secondary | ICD-10-CM

## 2019-06-04 NOTE — Progress Notes (Signed)
   Covid-19 Vaccination Clinic  Name:  [name]    MRN: [id number] DOB: [birth year]  06/04/2019  [name] was observed post Covid-19 immunization for 15 minutes without incident. She was provided with Vaccine Information Sheet and instruction to access the V-Safe system.   [name] was instructed to call 911 with any severe reactions post vaccine: Marland Kitchen Difficulty breathing  . Swelling of face and throat  . A fast heartbeat  . A bad rash all over body  . Dizziness and weakness   Immunizations Administered    Name Date Dose VIS Date Route   Moderna COVID-19 Vaccine 06/04/2019  8:49 AM 0.5 mL 02/09/2019 Intramuscular   Manufacturer: Moderna   Lot: [id number]   NDC: 98421-031-28

## 2019-07-07 ENCOUNTER — Ambulatory Visit: Payer: 59 | Attending: Internal Medicine

## 2019-07-07 DIAGNOSIS — Z23 Encounter for immunization: Secondary | ICD-10-CM

## 2019-07-07 NOTE — Progress Notes (Signed)
   Covid-19 Vaccination Clinic  Name:  CHIYEKO FERRE    MRN: [id number] DOB: 17-Feb-1961  07/07/2019  Ms. Thew was observed post Covid-19 immunization for 15 minutes without incident. She was provided with Vaccine Information Sheet and instruction to access the V-Safe system.   Ms. Voigt was instructed to call 911 with any severe reactions post vaccine: Marland Kitchen Difficulty breathing  . Swelling of face and throat  . A fast heartbeat  . A bad rash all over body  . Dizziness and weakness   Immunizations Administered    Name Date Dose VIS Date Route   Moderna COVID-19 Vaccine 07/07/2019  8:40 AM 0.5 mL 02/2019 Intramuscular   Manufacturer: Moderna   Lot: [id number]   NDC: 37902-409-73

## 2019-09-09 ENCOUNTER — Ambulatory Visit: Payer: BC Managed Care – PPO | Admitting: Orthopaedic Surgery

## 2019-09-09 ENCOUNTER — Encounter: Payer: Self-pay | Admitting: Orthopaedic Surgery

## 2019-09-09 ENCOUNTER — Other Ambulatory Visit: Payer: Self-pay

## 2019-09-09 VITALS — BP 199/122 | HR 71 | Ht 63.0 in | Wt 193.0 lb

## 2019-09-09 DIAGNOSIS — M25561 Pain in right knee: Secondary | ICD-10-CM

## 2019-09-09 NOTE — Progress Notes (Signed)
Subjective:    Patient ID: [name], female    DOB: Jul 15, 1960, 59 y.o.   MRN: [id number]  HPI She fell going into church about three weeks ago and hit her right knee medially.  She has had pain and swelling.  She was seen at [facility] on 08-27-19 and again on 09-02-19.  I have reviewed her notes.  I have independently reviewed and interpreted x-rays of this patient done at another site by another physician or qualified health professional.  She was placed first on Relafen 750 and then Naprosyn 500.    She has a hematoma on the medial knee on the right near the patella that hurts.  She has no redness.  She is using a cane.  She has no other injury,no other joint pain.   Review of Systems  Constitutional: Positive for activity change.  Musculoskeletal: Positive for arthralgias, gait problem and joint swelling.  All other systems reviewed and are negative.  For Review of Systems, all other systems reviewed and are negative.  The following is a summary of the past history medically, past history surgically, known current medicines, social history and family history.  This information is gathered electronically by the computer from prior information and documentation.  I review this each visit and have found including this information at this point in the chart is beneficial and informative.   Past Medical History:  Diagnosis Date  . Anemia    NOS   . Hyperlipidemia   . Renal failure     Past Surgical History:  Procedure Laterality Date  . ABDOMINAL HYSTERECTOMY    . hysterectomy      Current Outpatient Medications on File Prior to Visit  Medication Sig Dispense Refill  . HYDROcodone-acetaminophen (NORCO/VICODIN) 5-325 MG per tablet Take 1 tablet by mouth every 6 (six) hours as needed. 20 tablet 0  . Multiple Vitamin (MULTIVITAMINS PO) Take 1 tablet by mouth daily.      No current facility-administered medications on file prior to visit.    Social History    Socioeconomic History  . Marital status: Married    Spouse name: Not on file  . Number of children: Not on file  . Years of education: Not on file  . Highest education level: Not on file  Occupational History  . Not on file  Tobacco Use  . Smoking status: Never Smoker  . Smokeless tobacco: Never Used  Substance and Sexual Activity  . Alcohol use: No  . Drug use: No  . Sexual activity: Not on file  Other Topics Concern  . Not on file  Social History Narrative  . Not on file   Social Determinants of Health   Financial Resource Strain:   . Difficulty of Paying Living Expenses:   Food Insecurity:   . Worried About Programme researcher, broadcasting/film/video in the Last Year:   . Barista in the Last Year:   Transportation Needs:   . Freight forwarder (Medical):   Marland Kitchen Lack of Transportation (Non-Medical):   Physical Activity:   . Days of Exercise per Week:   . Minutes of Exercise per Session:   Stress:   . Feeling of Stress :   Social Connections:   . Frequency of Communication with Friends and Family:   . Frequency of Social Gatherings with Friends and Family:   . Attends Religious Services:   . Active Member of Clubs or Organizations:   . Attends Banker  Meetings:   Marland Kitchen Marital Status:   Intimate Partner Violence:   . Fear of Current or Ex-Partner:   . Emotionally Abused:   Marland Kitchen Physically Abused:   . Sexually Abused:     Family History  Problem Relation Age of Onset  . COPD Mother   . Emphysema Mother     BP (!) 199/122   Pulse 71   Ht 5\' 3"  (1.6 m)   Wt 193 lb (87.5 kg)   BMI 34.19 kg/m   Body mass index is 34.19 kg/m.     Objective:   Physical Exam Vitals and nursing note reviewed.  Constitutional:      Appearance: She is well-developed.  HENT:     Head: Normocephalic and atraumatic.  Eyes:     Conjunctiva/sclera: Conjunctivae normal.     Pupils: Pupils are equal, round, and reactive to light.  Cardiovascular:     Rate and Rhythm: Normal rate  and regular rhythm.  Pulmonary:     Effort: Pulmonary effort is normal.  Abdominal:     Palpations: Abdomen is soft.  Musculoskeletal:     Cervical back: Normal range of motion and neck supple.       Legs:  Skin:    General: Skin is warm and dry.  Neurological:     Mental Status: She is alert and oriented to person, place, and time.     Cranial Nerves: No cranial nerve deficit.     Motor: No abnormal muscle tone.     Coordination: Coordination normal.     Deep Tendon Reflexes: Reflexes are normal and symmetric. Reflexes normal.  Psychiatric:        Behavior: Behavior normal.        Thought Content: Thought content normal.        Judgment: Judgment normal.           Assessment & Plan:   Encounter Diagnosis  Name Primary?  . Acute pain of right knee Yes   She has hematoma of the knee.    I have advised warm compresses and use of Aspercreme, Biofreeze or Voltaren Gel.  Stay out of work.  Continue the Naprosyn.  Return in two weeks.  Call if any problem.  Precautions discussed.   Electronically Signed , MD 7/1/20219:20 AM

## 2019-09-09 NOTE — Patient Instructions (Addendum)
Use Aspercreme, Biofreeze or Voltaren gel over the counter 2-3 times daily make sure you rub it in well each time you use it.   Use warm compress or heating pad to the knee for 30 minutes then use the cream (do not put the cream on first)  If you have access to a swimming pool to walk slowly in pool, that is okay too.   If it gets worse let us know we will see you sooner

## 2019-09-23 ENCOUNTER — Encounter: Payer: Self-pay | Admitting: Orthopaedic Surgery

## 2019-09-23 ENCOUNTER — Other Ambulatory Visit: Payer: Self-pay

## 2019-09-23 ENCOUNTER — Ambulatory Visit (INDEPENDENT_AMBULATORY_CARE_PROVIDER_SITE_OTHER): Payer: BC Managed Care – PPO | Admitting: Orthopaedic Surgery

## 2019-09-23 VITALS — BP 180/21 | HR 69 | Ht 63.0 in | Wt 193.0 lb

## 2019-09-23 DIAGNOSIS — M25561 Pain in right knee: Secondary | ICD-10-CM | POA: Diagnosis not present

## 2019-09-23 NOTE — Progress Notes (Signed)
Patient PZ:WCHEN [name], female DOB:[birth year], 59 y.o. IDP:[id number]  Chief Complaint  Patient presents with  . Knee Pain    R/ it still hurts and swollen, has a knot on it    HPI  [name] is a 59 y.o. female who continues to have pain in the right knee.  She has more medial pain.  She has swelling and popping but no giving way.  She is using a crutch.  I will begin PT.  Continue the Naprosyn.   Body mass index is 34.19 kg/m.  ROS  Review of Systems  Constitutional: Positive for activity change.  Musculoskeletal: Positive for arthralgias, gait problem and joint swelling.  All other systems reviewed and are negative.   All other systems reviewed and are negative.  The following is a summary of the past history medically, past history surgically, known current medicines, social history and family history.  This information is gathered electronically by the computer from prior information and documentation.  I review this each visit and have found including this information at this point in the chart is beneficial and informative.    Past Medical History:  Diagnosis Date  . Anemia    NOS   . Hyperlipidemia   . Renal failure     Past Surgical History:  Procedure Laterality Date  . ABDOMINAL HYSTERECTOMY    . hysterectomy      Family History  Problem Relation Age of Onset  . COPD Mother   . Emphysema Mother     Social History Social History   Tobacco Use  . Smoking status: Never Smoker  . Smokeless tobacco: Never Used  Substance Use Topics  . Alcohol use: No  . Drug use: No    No Known Allergies  Current Outpatient Medications  Medication Sig Dispense Refill  . HYDROcodone-acetaminophen (NORCO/VICODIN) 5-325 MG per tablet Take 1 tablet by mouth every 6 (six) hours as needed. 20 tablet 0  . Multiple Vitamin (MULTIVITAMINS PO) Take 1 tablet by mouth daily.      No current facility-administered medications for this visit.     Physical  Exam  Blood pressure (!) 180/21, pulse 69, height 5\' 3"  (1.6 m), weight 193 lb (87.5 kg).  Constitutional: overall normal hygiene, normal nutrition, well developed, normal grooming, normal body habitus. Assistive device:crutches  Musculoskeletal: gait and station Limp right, muscle tone and strength are normal, no tremors or atrophy is present.  .  Neurological: coordination overall normal.  Deep tendon reflex/nerve stretch intact.  Sensation normal.  Cranial nerves II-XII intact.   Skin:   Normal overall no scars, lesions, ulcers or rashes. No psoriasis.  Psychiatric: Alert and oriented x 3.  Recent memory intact, remote memory unclear.  Normal mood and affect. Well groomed.  Good eye contact.  Cardiovascular: overall no swelling, no varicosities, no edema bilaterally, normal temperatures of the legs and arms, no clubbing, cyanosis and good capillary refill.  Lymphatic: palpation is normal.  Right knee with pain, effusion, ROM 0 to 105, medial joint line pain, positive medial McMurray.  NV intact.  Limp right.  All other systems reviewed and are negative   The patient has been educated about the nature of the problem(s) and counseled on treatment options.  The patient appeared to understand what I have discussed and is in agreement with it.  Encounter Diagnosis  Name Primary?  . Acute pain of right knee Yes    PLAN Call if any problems.  Precautions discussed.  Continue current  medications.   Return to clinic 2 weeks   Begin PT.  Electronically Signed Darreld Mclean, MD 7/15/20219:46 AM

## 2019-09-27 ENCOUNTER — Ambulatory Visit (HOSPITAL_COMMUNITY)
Admission: RE | Admit: 2019-09-27 | Discharge: 2019-09-27 | Disposition: A | Discharge status: Home / Self Care | Payer: BC Managed Care – PPO | Source: Ambulatory Visit | Attending: Family Medicine | Admitting: Family Medicine

## 2019-09-27 ENCOUNTER — Other Ambulatory Visit: Payer: Self-pay

## 2019-09-27 ENCOUNTER — Other Ambulatory Visit (HOSPITAL_COMMUNITY): Payer: Self-pay | Admitting: Family Medicine

## 2019-09-27 DIAGNOSIS — M25561 Pain in right knee: Secondary | ICD-10-CM | POA: Diagnosis present

## 2019-09-30 ENCOUNTER — Telehealth: Payer: Self-pay | Admitting: Orthopaedic Surgery

## 2019-09-30 NOTE — Telephone Encounter (Signed)
Patient called to relay that she was scheduled for physical therapy at [facility] for first available date 10/14/19. Her appointment and work note are date 10/07/19. Please advise if therapy visit 8/5 is okay or if another location for therapy is advisable?

## 2019-09-30 NOTE — Telephone Encounter (Signed)
Routing to nurse regarding possible other location for therapy order

## 2019-09-30 NOTE — Telephone Encounter (Signed)
Yes, see if she can be seen elsewhere for PT.  Extend her note if needed to be out of work.  Thanks

## 2019-10-04 ENCOUNTER — Encounter: Payer: Self-pay | Admitting: Orthopaedic Surgery

## 2019-10-05 NOTE — Telephone Encounter (Signed)
LVM asking pt to let us know if she wants to keep PT appointment and push out note, or choose another facility.

## 2019-10-07 ENCOUNTER — Encounter: Payer: Self-pay | Admitting: Orthopaedic Surgery

## 2019-10-07 ENCOUNTER — Other Ambulatory Visit: Payer: Self-pay

## 2019-10-07 ENCOUNTER — Ambulatory Visit (INDEPENDENT_AMBULATORY_CARE_PROVIDER_SITE_OTHER): Payer: BC Managed Care – PPO | Admitting: Orthopaedic Surgery

## 2019-10-07 ENCOUNTER — Telehealth: Payer: Self-pay | Admitting: Orthopaedic Surgery

## 2019-10-07 VITALS — Ht 63.0 in | Wt 193.0 lb

## 2019-10-07 DIAGNOSIS — M25561 Pain in right knee: Secondary | ICD-10-CM | POA: Diagnosis not present

## 2019-10-07 NOTE — Patient Instructions (Signed)
Note for work

## 2019-10-07 NOTE — Progress Notes (Signed)
Patient [name], female DOB:October 07, 1960, 59 y.o. BCW:[id number]  Chief Complaint  Patient presents with  . Knee Pain    right     HPI  [name] is a 59 y.o. female who has continued pain of the right knee. She has swelling, popping and giving way.  She had schedule mix-up and could not go to PT.  She is scheduled for August 5.  She has no new trauma.  She is using her brace.   Body mass index is 34.19 kg/m.  ROS  Review of Systems  Constitutional: Positive for activity change.  Musculoskeletal: Positive for arthralgias, gait problem and joint swelling.  All other systems reviewed and are negative.   All other systems reviewed and are negative.  The following is a summary of the past history medically, past history surgically, known current medicines, social history and family history.  This information is gathered electronically by the computer from prior information and documentation.  I review this each visit and have found including this information at this point in the chart is beneficial and informative.    Past Medical History:  Diagnosis Date  . Anemia    NOS   . Hyperlipidemia   . Renal failure     Past Surgical History:  Procedure Laterality Date  . ABDOMINAL HYSTERECTOMY    . hysterectomy      Family History  Problem Relation Age of Onset  . COPD Mother   . Emphysema Mother     Social History Social History   Tobacco Use  . Smoking status: Never Smoker  . Smokeless tobacco: Never Used  Substance Use Topics  . Alcohol use: No  . Drug use: No    No Known Allergies  Current Outpatient Medications  Medication Sig Dispense Refill  . HYDROcodone-acetaminophen (NORCO/VICODIN) 5-325 MG per tablet Take 1 tablet by mouth every 6 (six) hours as needed. 20 tablet 0  . Multiple Vitamin (MULTIVITAMINS PO) Take 1 tablet by mouth daily.      No current facility-administered medications for this visit.     Physical Exam  Height 5\' 3"  (1.6 m),  weight 193 lb (87.5 kg).  Constitutional: overall normal hygiene, normal nutrition, well developed, normal grooming, normal body habitus. Assistive device:none  Musculoskeletal: gait and station Limp right, muscle tone and strength are normal, no tremors or atrophy is present.  .  Neurological: coordination overall normal.  Deep tendon reflex/nerve stretch intact.  Sensation normal.  Cranial nerves II-XII intact.   Skin:   Normal overall no scars, lesions, ulcers or rashes. No psoriasis.  Psychiatric: Alert and oriented x 3.  Recent memory intact, remote memory unclear.  Normal mood and affect. Well groomed.  Good eye contact.  Cardiovascular: overall no swelling, no varicosities, no edema bilaterally, normal temperatures of the legs and arms, no clubbing, cyanosis and good capillary refill.  Lymphatic: palpation is normal.  Right knee has effusion, crepitus, pain, ROM 0 to 105, medial pain, positive medial McMurray, limp right.   All other systems reviewed and are negative   The patient has been educated about the nature of the problem(s) and counseled on treatment options.  The patient appeared to understand what I have discussed and is in agreement with it.  Encounter Diagnosis  Name Primary?  . Acute pain of right knee Yes    PLAN Call if any problems.  Precautions discussed.  Continue current medications.   Return to clinic 2 weeks   Go to PT.  Consider MRI  Electronically Signed Darreld Mclean, MD 7/29/202111:33 AM

## 2019-10-07 NOTE — Telephone Encounter (Signed)
Patient requests refill on Hydrocodone/Acetaminophen 5-325  Mgs.  Qty  20  Sig: Take 1 tablet by mouth every 6 (six) hours as needed.  Patient states she uses [facility] on [address].

## 2019-10-11 ENCOUNTER — Ambulatory Visit (HOSPITAL_COMMUNITY): Payer: BC Managed Care – PPO | Admitting: Physical Therapy

## 2019-10-11 MED ORDER — HYDROCODONE-ACETAMINOPHEN 5-325 MG PO TABS: ORAL_TABLET | ORAL | 0 refills | Status: DC

## 2019-10-14 ENCOUNTER — Ambulatory Visit (HOSPITAL_COMMUNITY): Payer: BC Managed Care – PPO | Attending: Orthopaedic Surgery | Admitting: Physical Therapy

## 2019-10-14 ENCOUNTER — Other Ambulatory Visit: Payer: Self-pay

## 2019-10-14 DIAGNOSIS — R262 Difficulty in walking, not elsewhere classified: Secondary | ICD-10-CM | POA: Diagnosis present

## 2019-10-14 DIAGNOSIS — M25561 Pain in right knee: Secondary | ICD-10-CM | POA: Diagnosis not present

## 2019-10-14 NOTE — Therapy (Signed)
[facility] [address] [place], [place], [address] Phone: [phone]   Fax:  [phone]  Physical Therapy Evaluation  Patient Details  Name: [name] MRN: [id number] Date of Birth: Mar 26, 1960 Referring Provider (PT): Marice PotterQayne Keeling   Encounter Date: 10/14/2019   PT End of Session - 10/14/19 0910    Visit Number 1    Number of Visits 12    Date for PT Re-Evaluation 11/25/19    Authorization Type BCBS no VL, no auth    Progress Note Due on Visit 10    PT Start Time 0913    PT Stop Time 0948    PT Time Calculation (min) 35 min    Activity Tolerance Patient tolerated treatment well    Behavior During Therapy [facility] for tasks assessed/performed           Past Medical History:  Diagnosis Date  . Anemia    NOS   . Hyperlipidemia   . Renal failure     Past Surgical History:  Procedure Laterality Date  . ABDOMINAL HYSTERECTOMY    . hysterectomy      There were no vitals filed for this visit.    Subjective Assessment - 10/14/19 0926    Subjective June 9th helping an older lady at church she fell on the inside of her knee. States that it immediately swelled up. States that it feels like has gotten better but she can't straighten her knee. States that she can't work as she has to stand all day and has been out of work since the 18th. States that she wants to get back to work. States she has had x-rays and no fractures were found. States she was using a cane as needed but she is no longer using a cane state prior to cane she was using crutches. States that her pain is worse when standing too long (1 hour) and at the end of the day when she lays down she has more pain. Describes pain as sharp, throbbing and is along medial aspect of knee. Reports she wears a brace with standing and if she leaves it on too long it hurts.    Pertinent History Diabetic    Currently in Pain? Yes    Pain Score 3     Pain Location Knee    Pain Orientation  Right    Pain Descriptors / Indicators Aching    Pain Type Acute pain              [facility] PT Assessment - 10/14/19 0001      Assessment   Medical Diagnosis R knee pain     Referring Provider (PT) Marice PotterQayne Keeling    Prior Therapy a long time ago       Precautions   Precautions None      Restrictions   Weight Bearing Restrictions No      Balance Screen   Has the patient fallen in the past 6 months Yes    How many times? 1    Has the patient had a decrease in activity level because of a fear of falling?  Yes    Is the patient reluctant to leave their home because of a fear of falling?  No      Home Environment   Living Environment Private residence    Living Arrangements Alone    Available Help at Discharge Family    Type of Home House    Home Access Stairs to  enter    Entrance Stairs-Number of Steps 2    Entrance Stairs-Rails None    Home Layout Laundry or work area in [employer] - single point    Additional Comments takes stairs one at time      Prior Function   Level of Independence Independent      Observation/Other Assessments   Focus on Therapeutic Outcomes (FOTO)  39% function      Observation/Other Assessments-Edema    Edema Circumferential      Circumferential Edema   Circumferential - Right 43.0   at knee joint line   Circumferential - Left  40.4cm   at knee joint line     ROM / Strength   AROM / PROM / Strength AROM;Strength      AROM   AROM Assessment Site Knee    Right/Left Knee Right;Left    Right Knee Extension 15   lacking   Left Knee Extension 0    Left Knee Flexion 130      Strength   Overall Strength Comments moderate quad contraction noted but pain limited strength testing on this date    Strength Assessment Site Knee    Right/Left Knee Right;Left      Palpation   Patella mobility limited patella mobility noted and increased resting tone in quad on R.     Palpation comment tenderness to palpation to medial knee,  posterior knee and distal quad on R      Special Tests   Other special tests neeg varus stress R, + valgus stress R       Ambulation/Gait   Ambulation/Gait Yes    Ambulation Distance (Feet) 250 Feet    Assistive device None    Gait Pattern Wide base of support;Antalgic;Decreased weight shift to right;Decreased hip/knee flexion - right    Gait velocity decreased     Gait Comments                       Objective measurements completed on examination: See above findings.       [facility] Adult PT Treatment/Exercise - 10/14/19 0001      Exercises   Exercises Knee/Hip      Knee/Hip Exercises: Supine   Quad Sets 2 sets;10 reps;Right   5" holds   Heel Slides 20 reps;Right                  PT Education - 10/14/19 0947    Education Details educated patient in elevation, decreasing use of brace and using cane to offload knee.    Person(s) Educated Patient    Methods Explanation    Comprehension Verbalized understanding            PT Short Term Goals - 10/14/19 0950      PT SHORT TERM GOAL #1   Title Patient will demonstrate between 0-120 degrees of right knee ROM    Time 3    Period Weeks    Status New    Target Date 11/04/19      PT SHORT TERM GOAL #2   Title Patient will report at least 25% improvement in overall symptoms and/or function to demonstrate improved functional mobility    Time 3    Period Weeks    Status New    Target Date 11/04/19      PT SHORT TERM GOAL #3   Title Patient will be independent in self management strategies to improve  quality of life and functional outcomes.    Time 3    Period Weeks    Status New    Target Date 11/04/19             PT Long Term Goals - 10/14/19 0951      PT LONG TERM GOAL #1   Title Patient will report at least 50% improvement in overall symptoms and/or function to demonstrate improved functional mobility    Time 6    Period Weeks    Status New    Target Date 11/25/19      PT LONG  TERM GOAL #2   Title Patient will improve on FOTO score to meet predicted outcomes to demonstrate improved functional mobility.    Time 6    Period Weeks    Status New    Target Date 11/25/19      PT LONG TERM GOAL #3   Title Patient will be able to ascend and descend stairs with reciprocal gait pattern and without railing to improve ability to go up/down stairs at home.    Time 6    Period Weeks    Status New    Target Date 11/25/19                  Plan - 10/14/19 0953    Clinical Impression Statement Patient presents to therapy with acute right knee pain after a fall in June. Knee presents with swelling and limited mobility which severely limits patient ability to stand and perform work required tasks. Patient is eager to return to work and has been out of work since she saw the MD. Educated patient on current presentation and POC. Instructed patient on elevation secondary to swelling and discontinuing use of brace as it was acting more like a tourniquet around her knee. Patient would benefit from skilled physical therapy to improve overall function and return her to work.    Personal Factors and Comorbidities Comorbidity 1    Comorbidities Diabetes    Examination-Activity Limitations Bend;Squat;Sleep;Bed Mobility;Stairs;Stand;Transfers;Locomotion Level;Lift    Examination-Participation Restrictions Other;Yard Work;Meal Prep;Occupation;Community Activity   work   Stability/Clinical Decision Making Stable/Uncomplicated    Clinical Decision Making Low    Rehab Potential Good    PT Frequency 2x / week    PT Duration 6 weeks    PT Treatment/Interventions ADLs/Self Care Home Management;Aquatic Therapy;Electrical Stimulation;Iontophoresis 4mg /ml Dexamethasone;Moist Heat;Traction;Balance training;Therapeutic exercise;Therapeutic activities;Functional mobility training;Stair training;Gait training;DME Instruction;Neuromuscular re-education;Patient/family education;Manual techniques;Dry  needling;Passive range of motion    PT Next Visit Plan f/u with elevation adherance, knee ROM, edema management, quad strength, patella mobility    PT Home Exercise Plan heel slides, quad sets, elevation, using cane to offload knee, discontinue brace           Patient will benefit from skilled therapeutic intervention in order to improve the following deficits and impairments:  Abnormal gait, Pain, Decreased strength, Decreased activity tolerance, Decreased mobility, Difficulty walking, Decreased balance, Decreased range of motion, Decreased endurance, Decreased knowledge of use of DME  Visit Diagnosis: Acute pain of right knee     Problem List Patient Active Problem List   Diagnosis Date Noted  . ANKLE SPRAIN, RIGHT 03/27/2010  . HYPERLIPIDEMIA 03/25/2006  . OBESITY NOS 03/25/2006  . ANEMIA-NOS 03/25/2006  . BOWEL OBSTRUCTION 03/25/2006  . RENAL FAILURE 03/25/2006  . 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

## 2019-10-21 ENCOUNTER — Encounter: Payer: Self-pay | Admitting: Orthopaedic Surgery

## 2019-10-21 ENCOUNTER — Ambulatory Visit (INDEPENDENT_AMBULATORY_CARE_PROVIDER_SITE_OTHER): Payer: BC Managed Care – PPO | Admitting: Orthopaedic Surgery

## 2019-10-21 ENCOUNTER — Other Ambulatory Visit: Payer: Self-pay

## 2019-10-21 VITALS — BP 128/80 | HR 77 | Ht 63.0 in | Wt 195.1 lb

## 2019-10-21 DIAGNOSIS — M25561 Pain in right knee: Secondary | ICD-10-CM

## 2019-10-21 NOTE — Progress Notes (Signed)
Patient ZT:[id number] [name], female DOB:1960/06/24, 59 y.o. DXI:[id number]  Chief Complaint  Patient presents with  . Knee Pain    R/ hurting and some swelling    HPI  [name] is a 59 y.o. female who has knee pain on the right.  She has been going to PT.  That has helped.  She is taking her medicine.  She has no new trauma.  She is a little better. She still has swelling and popping.   Body mass index is 34.56 kg/m.  ROS  Review of Systems  Constitutional: Positive for activity change.  Musculoskeletal: Positive for arthralgias, gait problem and joint swelling.  All other systems reviewed and are negative.   All other systems reviewed and are negative.  The following is a summary of the past history medically, past history surgically, known current medicines, social history and family history.  This information is gathered electronically by the computer from prior information and documentation.  I review this each visit and have found including this information at this point in the chart is beneficial and informative.    Past Medical History:  Diagnosis Date  . Anemia    NOS   . Hyperlipidemia   . Renal failure     Past Surgical History:  Procedure Laterality Date  . ABDOMINAL HYSTERECTOMY    . hysterectomy      Family History  Problem Relation Age of Onset  . COPD Mother   . Emphysema Mother     Social History Social History   Tobacco Use  . Smoking status: Never Smoker  . Smokeless tobacco: Never Used  Substance Use Topics  . Alcohol use: No  . Drug use: No    No Known Allergies  Current Outpatient Medications  Medication Sig Dispense Refill  . HYDROcodone-acetaminophen (NORCO/VICODIN) 5-325 MG tablet One tablet every four hours for pain. 30 tablet 0  . Multiple Vitamin (MULTIVITAMINS PO) Take 1 tablet by mouth daily.      No current facility-administered medications for this visit.     Physical Exam  Blood pressure 128/80, pulse 77, height 5'  3" (1.6 m), weight 195 lb 2 oz (88.5 kg).  Constitutional: overall normal hygiene, normal nutrition, well developed, normal grooming, normal body habitus. Assistive device:none  Musculoskeletal: gait and station Limp right, muscle tone and strength are normal, no tremors or atrophy is present.  .  Neurological: coordination overall normal.  Deep tendon reflex/nerve stretch intact.  Sensation normal.  Cranial nerves II-XII intact.   Skin:   Normal overall no scars, lesions, ulcers or rashes. No psoriasis.  Psychiatric: Alert and oriented x 3.  Recent memory intact, remote memory unclear.  Normal mood and affect. Well groomed.  Good eye contact.  Cardiovascular: overall no swelling, no varicosities, no edema bilaterally, normal temperatures of the legs and arms, no clubbing, cyanosis and good capillary refill.  Lymphatic: palpation is normal.  Right knee tender, slight effusion and crepitus, slight limp, stable, NV intact.  All other systems reviewed and are negative   The patient has been educated about the nature of the problem(s) and counseled on treatment options.  The patient appeared to understand what I have discussed and is in agreement with it.  Encounter Diagnosis  Name Primary?  . Acute pain of right knee Yes    PLAN Call if any problems.  Precautions discussed.  Continue current medications.   Return to clinic 1 month   Continue PT.  Electronically Signed Darreld Mclean, MD  8/12/202110:25 AM

## 2019-10-25 ENCOUNTER — Telehealth: Payer: Self-pay | Admitting: Orthopaedic Surgery

## 2019-10-25 MED ORDER — HYDROCODONE-ACETAMINOPHEN 5-325 MG PO TABS: ORAL_TABLET | ORAL | 0 refills | Status: DC

## 2019-10-25 NOTE — Telephone Encounter (Signed)
Patient requests refill: HYDROcodone-acetaminophen (NORCO/VICODIN) 5-325 MG tablet 30 tablet  -[facility], [address] [place]

## 2019-10-25 NOTE — Telephone Encounter (Signed)
Done (patient kept appointment with [facility] out-patient rehab.)

## 2019-10-28 ENCOUNTER — Ambulatory Visit (HOSPITAL_COMMUNITY): Payer: BC Managed Care – PPO | Admitting: Physical Therapy

## 2019-10-28 ENCOUNTER — Other Ambulatory Visit: Payer: Self-pay

## 2019-10-28 DIAGNOSIS — M25561 Pain in right knee: Secondary | ICD-10-CM | POA: Diagnosis not present

## 2019-10-28 DIAGNOSIS — R262 Difficulty in walking, not elsewhere classified: Secondary | ICD-10-CM

## 2019-10-28 NOTE — Therapy (Signed)
[facility] [address] [place], [place], [address] Phone: [phone]   Fax:  [phone]  Physical Therapy Treatment  Patient Details  Name: [name] MRN: [id number] Date of Birth: [birth year] Referring Provider (PT): Marice Potter   Encounter Date: 10/28/2019   PT End of Session - 10/28/19 1211    Visit Number 2    Number of Visits 12    Date for PT Re-Evaluation 11/25/19    Authorization Type BCBS no VL, no auth    Progress Note Due on Visit 10    PT Start Time 1122    PT Stop Time 1204    PT Time Calculation (min) 42 min    Activity Tolerance Patient tolerated treatment well    Behavior During Therapy [facility] for tasks assessed/performed           Past Medical History:  Diagnosis Date   Anemia    NOS    Hyperlipidemia    Renal failure     Past Surgical History:  Procedure Laterality Date   ABDOMINAL HYSTERECTOMY     hysterectomy      There were no vitals filed for this visit.   Subjective Assessment - 10/28/19 1135    Subjective Pt states she doesn't know what caused her to fall other than maybe loss of balance.  Statess currently 8/10 pain in Rt knee and continues to remained swelled.  States since switching to a neoprene brace it feels a little better.    Currently in Pain? Yes    Pain Score 8     Pain Location Knee    Pain Orientation Right    Pain Descriptors / Indicators Aching;Sore;Tightness              [facility] PT Assessment - 10/28/19 0001      AROM   Right Knee Extension 10    Right Knee Flexion 105      Strength   Right Hip Flexion 3+/5    Right Hip Extension 3/5    Right Hip ABduction 3+/5    Left Hip Flexion 5/5    Left Hip Extension 4+/5    Left Hip ABduction 5/5    Right Knee Flexion 4/5    Right Knee Extension 3/5    Left Knee Flexion 4/5    Left Knee Extension 5/5                         [facility] Adult PT Treatment/Exercise - 10/28/19 0001      Knee/Hip  Exercises: Seated   Long Arc Quad Both;10 reps      Knee/Hip Exercises: Supine   Quad Sets 2 sets;10 reps;Right    Bridges 10 reps    Straight Leg Raises 10 reps      Knee/Hip Exercises: Sidelying   Hip ABduction Right;10 reps      Knee/Hip Exercises: Prone   Hamstring Curl 10 reps    Hip Extension Both;10 reps      Manual Therapy   Manual Therapy Edema management    Manual therapy comments completed at EOS with elevation, seperate from all other skilled treatment    Edema Management to Rt knee with elevation                  PT Education - 10/28/19 1213    Education Details Review of goals, HEP and POC moving forward.  Educated on compression garments, measured and given form  to order to help edema and vein support.    Person(s) Educated Patient    Methods Explanation;Demonstration;Handout    Comprehension Verbalized understanding            PT Short Term Goals - 10/28/19 1136      PT SHORT TERM GOAL #1   Title Patient will demonstrate between 0-120 degrees of right knee ROM    Time 3    Period Weeks    Status On-going    Target Date 11/04/19      PT SHORT TERM GOAL #2   Title Patient will report at least 25% improvement in overall symptoms and/or function to demonstrate improved functional mobility    Time 3    Period Weeks    Status On-going    Target Date 11/04/19      PT SHORT TERM GOAL #3   Title Patient will be independent in self management strategies to improve quality of life and functional outcomes.    Time 3    Period Weeks    Status On-going    Target Date 11/04/19             PT Long Term Goals - 10/28/19 1137      PT LONG TERM GOAL #1   Title Patient will report at least 50% improvement in overall symptoms and/or function to demonstrate improved functional mobility    Time 6    Period Weeks    Status On-going      PT LONG TERM GOAL #2   Title Patient will improve on FOTO score to meet predicted outcomes to demonstrate  improved functional mobility.    Time 6    Period Weeks    Status On-going      PT LONG TERM GOAL #3   Title Patient will be able to ascend and descend stairs with reciprocal gait pattern and without railing to improve ability to go up/down stairs at home.    Time 6    Period Weeks    Status On-going                 Plan - 10/28/19 1216    Clinical Impression Statement REveiwed goals and POC moving forward.  Tested ROM today in Rt knee of 10-110 in supine.  MMT reveals extreme weakness of Rt as compared to Lt.  MMT did not elicit pain, just weakness.  Began strengthening exercises to address this and retro massage at EOS to help with edema.  Edcuated on compression garments and encouarged to order these to assist with edema and vein support.  Pt verbalized understanding.  Overall improvement in pain and reduced antalgia at EOS today.    Personal Factors and Comorbidities Comorbidity 1    Comorbidities Diabetes    Examination-Activity Limitations Bend;Squat;Sleep;Bed Mobility;Stairs;Stand;Transfers;Locomotion Level;Lift    Examination-Participation Restrictions Other;Yard Work;Meal Prep;Occupation;Community Activity   work   Stability/Clinical Decision Making Stable/Uncomplicated    Rehab Potential Good    PT Frequency 2x / week    PT Duration 6 weeks    PT Treatment/Interventions ADLs/Self Care Home Management;Aquatic Therapy;Electrical Stimulation;Iontophoresis 4mg /ml Dexamethasone;Moist Heat;Traction;Balance training;Therapeutic exercise;Therapeutic activities;Functional mobility training;Stair training;Gait training;DME Instruction;Neuromuscular re-education;Patient/family education;Manual techniques;Dry needling;Passive range of motion    PT Next Visit Plan continue to address Rt knee edema.  Progress strengthening and assess balance/gait next session.    PT Home Exercise Plan heel slides, quad sets, elevation, using cane to offload knee, discontinue brace           Patient  will benefit from skilled therapeutic intervention in order to improve the following deficits and impairments:  Abnormal gait, Pain, Decreased strength, Decreased activity tolerance, Decreased mobility, Difficulty walking, Decreased balance, Decreased range of motion, Decreased endurance, Decreased knowledge of use of DME  Visit Diagnosis: Acute pain of right knee  Difficulty in walking, not elsewhere classified     Problem List Patient Active Problem List   Diagnosis Date Noted   ANKLE SPRAIN, RIGHT 03/27/2010   HYPERLIPIDEMIA 03/25/2006   OBESITY NOS 03/25/2006   ANEMIA-NOS 03/25/2006   BOWEL OBSTRUCTION 03/25/2006   RENAL FAILURE 03/25/2006   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

## 2019-11-02 ENCOUNTER — Encounter (HOSPITAL_COMMUNITY): Payer: Self-pay | Admitting: Physical Therapy

## 2019-11-02 ENCOUNTER — Ambulatory Visit (HOSPITAL_COMMUNITY): Payer: BC Managed Care – PPO | Admitting: Physical Therapy

## 2019-11-02 ENCOUNTER — Other Ambulatory Visit: Payer: Self-pay

## 2019-11-02 DIAGNOSIS — M25561 Pain in right knee: Secondary | ICD-10-CM

## 2019-11-02 DIAGNOSIS — R262 Difficulty in walking, not elsewhere classified: Secondary | ICD-10-CM

## 2019-11-02 NOTE — Therapy (Signed)
[facility] [address] [place], [place], [address] Phone: [phone]   Fax:  [phone]  Physical Therapy Treatment  Patient Details  Name: [name] MRN: [id number] Date of Birth: 1960-12-18 Referring Provider (PT): Marice Potter   Encounter Date: 11/02/2019   PT End of Session - 11/02/19 0910    Visit Number 3    Number of Visits 12    Date for PT Re-Evaluation 11/25/19    Authorization Type BCBS no VL, no auth    Progress Note Due on Visit 10    PT Start Time 0910    PT Stop Time 0950    PT Time Calculation (min) 40 min    Activity Tolerance Patient tolerated treatment well    Behavior During Therapy [facility] for tasks assessed/performed           Past Medical History:  Diagnosis Date  . Anemia    NOS   . Hyperlipidemia   . Renal failure     Past Surgical History:  Procedure Laterality Date  . ABDOMINAL HYSTERECTOMY    . hysterectomy      There were no vitals filed for this visit.   Subjective Assessment - 11/02/19 0938    Subjective States no current pain. States that when she tries to bend her knee she has pain. States she has been doing her exercises and is anxious to get back to work. With bending her knee is about a 5/10 along the sides of the knee (points to knee cap).    Currently in Pain? Yes    Pain Score 5     Pain Location Knee    Pain Orientation Right              [facility] PT Assessment - 11/02/19 0001      Assessment   Medical Diagnosis R knee pain     Referring Provider (PT) Marice Potter                         [facility] Adult PT Treatment/Exercise - 11/02/19 0001      Knee/Hip Exercises: Seated   Other Seated Knee/Hip Exercises self patella mobilizations x15 5" holds      Knee/Hip Exercises: Supine   Heel Slides Right   performed throughout session between flexion exercises    Straight Leg Raises AROM;Strengthening;Right;5 sets;5 reps    Knee Extension AROM;Right    Knee  Extension Limitations 5   lacking.    Knee Flexion Right;AROM    Knee Flexion Limitations 118      Knee/Hip Exercises: Prone   Hamstring Curl 4 sets;5 reps    Other Prone Exercises quad set 2x10 5" holds R, 5 on L prior       Manual Therapy   Manual Therapy Joint mobilization;Soft tissue mobilization    Manual therapy comments seperate from all other skilled treatment    Joint Mobilization R tibial mobilization into IR with flexion - grade II/III - tolerated well - improved ROM noted afterwards. R patella mobilization in lateral/medial/inferior directions grade II/III - improved extension noted afterwards. R knee traction edge of table with knee flexion and extension - tolerated well.     Soft tissue mobilization STM to right knee around patella - focus on ligamentous structures - tolerated well                      PT Short Term Goals - 10/28/19  1136      PT SHORT TERM GOAL #1   Title Patient will demonstrate between 0-120 degrees of right knee ROM    Time 3    Period Weeks    Status On-going    Target Date 11/04/19      PT SHORT TERM GOAL #2   Title Patient will report at least 25% improvement in overall symptoms and/or function to demonstrate improved functional mobility    Time 3    Period Weeks    Status On-going    Target Date 11/04/19      PT SHORT TERM GOAL #3   Title Patient will be independent in self management strategies to improve quality of life and functional outcomes.    Time 3    Period Weeks    Status On-going    Target Date 11/04/19             PT Long Term Goals - 10/28/19 1137      PT LONG TERM GOAL #1   Title Patient will report at least 50% improvement in overall symptoms and/or function to demonstrate improved functional mobility    Time 6    Period Weeks    Status On-going      PT LONG TERM GOAL #2   Title Patient will improve on FOTO score to meet predicted outcomes to demonstrate improved functional mobility.    Time 6     Period Weeks    Status On-going      PT LONG TERM GOAL #3   Title Patient will be able to ascend and descend stairs with reciprocal gait pattern and without railing to improve ability to go up/down stairs at home.    Time 6    Period Weeks    Status On-going                 Plan - 11/02/19 0913    Clinical Impression Statement Decreased swelling noted compared to initial session. With reduction in swelling able to palpate and visually see bump along anterior surface of knee below patella (and patella demonstrates patella alta). Hypomobility noted in right patella. Focused initially on patella mobility and then knee ROM. Reduction in bump noted end of session after manual work. Improved knee ROM noted end of session but continued lag with TKE noted and lack of full knee flexion. Continued reports of funning feeling behind knee end of session but no pain. Improved gait mechanics end of session. Will continue with ROM and strengthening as tolerated.    Personal Factors and Comorbidities Comorbidity 1    Comorbidities Diabetes    Examination-Activity Limitations Bend;Squat;Sleep;Bed Mobility;Stairs;Stand;Transfers;Locomotion Level;Lift    Examination-Participation Restrictions Other;Yard Work;Meal Prep;Occupation;Community Activity   work   Stability/Clinical Decision Making Stable/Uncomplicated    Rehab Potential Good    PT Frequency 2x / week    PT Duration 6 weeks    PT Treatment/Interventions ADLs/Self Care Home Management;Aquatic Therapy;Electrical Stimulation;Iontophoresis 4mg /ml Dexamethasone;Moist Heat;Traction;Balance training;Therapeutic exercise;Therapeutic activities;Functional mobility training;Stair training;Gait training;DME Instruction;Neuromuscular re-education;Patient/family education;Manual techniques;Dry needling;Passive range of motion    PT Next Visit Plan continue to address Rt knee edema and arthrokinematics. strengthening and assess balance/gait. KNee ROM and  strength- f/u with self patella mobilizations    PT Home Exercise Plan heel slides, quad sets, elevation, using cane to offload knee, discontinue brace; 8/24 hamstring curls, prone quad sets, self patella mobilizations           Patient will benefit from skilled therapeutic intervention in order to  improve the following deficits and impairments:  Abnormal gait, Pain, Decreased strength, Decreased activity tolerance, Decreased mobility, Difficulty walking, Decreased balance, Decreased range of motion, Decreased endurance, Decreased knowledge of use of DME  Visit Diagnosis: Acute pain of right knee  Difficulty in walking, not elsewhere classified     Problem List Patient Active Problem List   Diagnosis Date Noted  . ANKLE SPRAIN, RIGHT 03/27/2010  . HYPERLIPIDEMIA 03/25/2006  . OBESITY NOS 03/25/2006  . ANEMIA-NOS 03/25/2006  . BOWEL OBSTRUCTION 03/25/2006  . RENAL FAILURE 03/25/2006  . 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 [place], [place], [address] Phone: [phone]   Fax:  [phone]  Name: DEIONA HOOPER MRN: [id number] Date of Birth: 07-23-1960

## 2019-11-04 ENCOUNTER — Encounter (HOSPITAL_COMMUNITY): Payer: Self-pay | Admitting: Physical Therapy

## 2019-11-04 ENCOUNTER — Other Ambulatory Visit: Payer: Self-pay

## 2019-11-04 ENCOUNTER — Ambulatory Visit (HOSPITAL_COMMUNITY): Payer: BC Managed Care – PPO | Admitting: Physical Therapy

## 2019-11-04 DIAGNOSIS — M25561 Pain in right knee: Secondary | ICD-10-CM | POA: Diagnosis not present

## 2019-11-04 DIAGNOSIS — R262 Difficulty in walking, not elsewhere classified: Secondary | ICD-10-CM

## 2019-11-04 NOTE — Therapy (Signed)
[facility] [address] [place], [place], [address] Phone: [phone]   Fax:  [phone]  Physical Therapy Treatment  Patient Details  Name: [name] MRN: [id number] Date of Birth: [birth year] Referring Provider (PT): Marice Potter   Encounter Date: 11/04/2019   PT End of Session - 11/04/19 0919    Visit Number 4    Number of Visits 12    Date for PT Re-Evaluation 11/25/19    Authorization Type BCBS no VL, no auth    Progress Note Due on Visit 10    PT Start Time 0916    PT Stop Time 0955    PT Time Calculation (min) 39 min    Activity Tolerance Patient tolerated treatment well    Behavior During Therapy [facility] for tasks assessed/performed           Past Medical History:  Diagnosis Date  . Anemia    NOS   . Hyperlipidemia   . Renal failure     Past Surgical History:  Procedure Laterality Date  . ABDOMINAL HYSTERECTOMY    . hysterectomy      There were no vitals filed for this visit.   Subjective Assessment - 11/04/19 0919    Subjective States after last session she was able to about 2 laps around [place] without her brace. Reports no pain currently but had minor last night in her knee. States she has been doing her new exercises and they are helping.    Currently in Pain? No/denies              [facility] PT Assessment - 11/04/19 0001      Assessment   Medical Diagnosis R knee pain     Referring Provider (PT) Marice Potter                         [facility] Adult PT Treatment/Exercise - 11/04/19 0001      Ambulation/Gait   Ambulation/Gait Yes    Assistive device None    Gait Pattern Decreased hip/knee flexion - right    Gait Comments focused on heel contact x4 of 75 feet - then walked x4 75 ft for carryover      Knee/Hip Exercises: Stretches   Gastroc Stretch Right;3 reps;30 seconds   strap   Other Knee/Hip Stretches gastroc stretchwith mobilization belt and leaning back 3 minutes R  - x2       Knee/Hip Exercises: Standing   Heel Raises Both;5 seconds;5 reps;4 sets   feet together, slow lower     Knee/Hip Exercises: Supine   Heel Slides Right   between exercises for ROM   Knee Extension AROM;Right    Knee Extension Limitations 2   lacking   Knee Flexion Right;AROM    Knee Flexion Limitations 116      Manual Therapy   Manual Therapy Joint mobilization;Soft tissue mobilization    Manual therapy comments seperate from all other skilled treatment    Joint Mobilization R tibial mobilization into IR with flexion - grade II/III - tolerated well - improved ROM noted afterwards. R patella mobilization in lateral/medial/inferior directions grade II/III        Soft tissue mobilization STM to right knee around patella - focus on ligamentous structures - tolerated well ; STM to medial/distal hamstring and proximal gastroc on R                   PT Education -  11/04/19 0956    Education Details inHEP and how exercises will assist with TKE    Person(s) Educated Patient    Methods Handout;Explanation            PT Short Term Goals - 10/28/19 1136      PT SHORT TERM GOAL #1   Title Patient will demonstrate between 0-120 degrees of right knee ROM    Time 3    Period Weeks    Status On-going    Target Date 11/04/19      PT SHORT TERM GOAL #2   Title Patient will report at least 25% improvement in overall symptoms and/or function to demonstrate improved functional mobility    Time 3    Period Weeks    Status On-going    Target Date 11/04/19      PT SHORT TERM GOAL #3   Title Patient will be independent in self management strategies to improve quality of life and functional outcomes.    Time 3    Period Weeks    Status On-going    Target Date 11/04/19             PT Long Term Goals - 10/28/19 1137      PT LONG TERM GOAL #1   Title Patient will report at least 50% improvement in overall symptoms and/or function to demonstrate improved functional mobility      Time 6    Period Weeks    Status On-going      PT LONG TERM GOAL #2   Title Patient will improve on FOTO score to meet predicted outcomes to demonstrate improved functional mobility.    Time 6    Period Weeks    Status On-going      PT LONG TERM GOAL #3   Title Patient will be able to ascend and descend stairs with reciprocal gait pattern and without railing to improve ability to go up/down stairs at home.    Time 6    Period Weeks    Status On-going                 Plan - 11/04/19 0919    Clinical Impression Statement Focused on knee ROM today. Limitations noted in gastroc length, transitioned to calf stretch and this was tolerated well with improved TKE afterwards. Added heel raises to HEP as well gastroc stretch. Minor soreness noted in back of knee end of session but no increase in symptoms. Will continue to focus on knee ROM and strength as tolerated.    Personal Factors and Comorbidities Comorbidity 1    Comorbidities Diabetes    Examination-Activity Limitations Bend;Squat;Sleep;Bed Mobility;Stairs;Stand;Transfers;Locomotion Level;Lift    Examination-Participation Restrictions Other;Yard Work;Meal Prep;Occupation;Community Activity   work   Stability/Clinical Decision Making Stable/Uncomplicated    Rehab Potential Good    PT Frequency 2x / week    PT Duration 6 weeks    PT Treatment/Interventions ADLs/Self Care Home Management;Aquatic Therapy;Electrical Stimulation;Iontophoresis 4mg /ml Dexamethasone;Moist Heat;Traction;Balance training;Therapeutic exercise;Therapeutic activities;Functional mobility training;Stair training;Gait training;DME Instruction;Neuromuscular re-education;Patient/family education;Manual techniques;Dry needling;Passive range of motion    PT Next Visit Plan continue to address Rt knee edema and arthrokinematics.- TKE with walking strengthening and assess balance/gait. KNee ROM and strength- f/u with self patella mobilizations    PT Home Exercise Plan  heel slides, quad sets, elevation, using cane to offload knee, discontinue brace; 8/24 hamstring curls, prone quad sets, self patella mobilizations; 8/26 heel raise, gastroc stretch, heel contact with walking  Patient will benefit from skilled therapeutic intervention in order to improve the following deficits and impairments:  Abnormal gait, Pain, Decreased strength, Decreased activity tolerance, Decreased mobility, Difficulty walking, Decreased balance, Decreased range of motion, Decreased endurance, Decreased knowledge of use of DME  Visit Diagnosis: Acute pain of right knee  Difficulty in walking, not elsewhere classified     Problem List Patient Active Problem List   Diagnosis Date Noted  . ANKLE SPRAIN, RIGHT 03/27/2010  . HYPERLIPIDEMIA 03/25/2006  . OBESITY NOS 03/25/2006  . ANEMIA-NOS 03/25/2006  . BOWEL OBSTRUCTION 03/25/2006  . RENAL FAILURE 03/25/2006  . 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 [place], [place], [address] Phone: [phone]   Fax:  [phone]  Name: [name] MRN: [id number] Date of Birth: [birth year]

## 2019-11-04 NOTE — Patient Instructions (Signed)
Access Code: [email or web address] URL: [email or web address] Date: 11/04/2019 Prepared by: Revonda Humphrey  Exercises Long Sitting Calf Stretch with Strap - 3 reps - 30 hold Heel rises with counter support - 4 sets - 5 reps - 5 hold

## 2019-11-08 ENCOUNTER — Encounter (HOSPITAL_COMMUNITY): Payer: Self-pay

## 2019-11-08 ENCOUNTER — Ambulatory Visit (HOSPITAL_COMMUNITY): Payer: BC Managed Care – PPO

## 2019-11-08 ENCOUNTER — Other Ambulatory Visit: Payer: Self-pay

## 2019-11-08 DIAGNOSIS — M25561 Pain in right knee: Secondary | ICD-10-CM | POA: Diagnosis not present

## 2019-11-08 DIAGNOSIS — R262 Difficulty in walking, not elsewhere classified: Secondary | ICD-10-CM

## 2019-11-08 NOTE — Patient Instructions (Signed)
Therapeutic - Anterior Thigh Stretch with beach towel or belt    Keep left leg bent and hold right ankle lightly. Use towel or belt to bring right leg backwards at hip without arching back. Feel stretch in front of thigh. Hold _10___ seconds. Repeat 10___ times.   Copyright  VHI. All rights reserved.

## 2019-11-08 NOTE — Therapy (Signed)
[facility] [address] [place], [place], [address] Phone: [phone]   Fax:  [phone]  Physical Therapy Treatment  Patient Details  Name: [name] MRN: [id number] Date of Birth: 1960-03-16 Referring Provider (PT): Marice Potter   Encounter Date: 11/08/2019   PT End of Session - 11/08/19 1044    Visit Number 5    Number of Visits 12    Date for PT Re-Evaluation 11/25/19    Authorization Type BCBS no VL, no auth    Progress Note Due on Visit 10    PT Start Time 1049    PT Stop Time 1130    PT Time Calculation (min) 41 min    Activity Tolerance Patient tolerated treatment well    Behavior During Therapy [facility] for tasks assessed/performed           Past Medical History:  Diagnosis Date  . Anemia    NOS   . Hyperlipidemia   . Renal failure     Past Surgical History:  Procedure Laterality Date  . ABDOMINAL HYSTERECTOMY    . hysterectomy      There were no vitals filed for this visit.   Subjective Assessment - 11/08/19 1044    Subjective Has been performing her HEP; feels she is continuing to make progress.    Currently in Pain? Yes    Pain Score 2     Pain Location Knee    Pain Orientation Posterior;Medial;Right    Pain Descriptors / Indicators Burning            [facility] Adult PT Treatment/Exercise - 11/08/19 0001      Ambulation/Gait   Ambulation/Gait Yes    Assistive device None    Gait Pattern Decreased stance time - right;Right flexed knee in stance    Gait Comments focused on heel contact x4 of 75 feet - then walked x4 75 ft for carryover      Knee/Hip Exercises: Stretches   Passive Hamstring Stretch Right;2 reps;30 seconds    Passive Hamstring Stretch Limitations on 12' step    Quad Stretch Right;10 seconds    Quad Stretch Limitations 10 reps w/ seatbelt    Gastroc Stretch Both;2 reps;30 seconds    Gastroc Stretch Limitations slantboard      Knee/Hip Exercises: Standing   Heel Raises Both;5  seconds;5 reps;4 sets   feet together, slow lower     Knee/Hip Exercises: Supine   Straight Leg Raises Strengthening;AROM;Right;2 sets;10 reps      Manual Therapy   Manual Therapy Joint mobilization;Soft tissue mobilization    Manual therapy comments seperate from all other skilled treatment    Joint Mobilization R tibial mobilization into IR with flexion - grade II/III - tolerated well - improved ROM noted afterwards. R patella mobilization in lateral/medial/inferior directions grade II/III        Soft tissue mobilization STM to R dist ham and prox gastroc and distal ITB            PT Education - 11/08/19 1108    Education Details Discussed purpose and technique of interventions throughout session. Added to HEP    Person(s) Educated Patient    Methods Explanation;Demonstration;Handout    Comprehension Verbalized understanding;Need further instruction            PT Short Term Goals - 11/08/19 1255      PT SHORT TERM GOAL #1   Title Patient will demonstrate between 0-120 degrees of right knee ROM  Baseline 11/08/19 - lacking 2 to 116 degrees last session.    Time 3    Period Weeks    Status On-going    Target Date 11/04/19      PT SHORT TERM GOAL #2   Title Patient will report at least 25% improvement in overall symptoms and/or function to demonstrate improved functional mobility    Time 3    Period Weeks    Status On-going    Target Date 11/04/19      PT SHORT TERM GOAL #3   Title Patient will be independent in self management strategies to improve quality of life and functional outcomes.    Time 3    Period Weeks    Status On-going    Target Date 11/04/19             PT Long Term Goals - 11/08/19 1255      PT LONG TERM GOAL #1   Title Patient will report at least 50% improvement in overall symptoms and/or function to demonstrate improved functional mobility    Time 6    Period Weeks    Status On-going      PT LONG TERM GOAL #2   Title Patient will  improve on FOTO score to meet predicted outcomes to demonstrate improved functional mobility.    Time 6    Period Weeks    Status On-going      PT LONG TERM GOAL #3   Title Patient will be able to ascend and descend stairs with reciprocal gait pattern and without railing to improve ability to go up/down stairs at home.    Time 6    Period Weeks    Status On-going                 Plan - 11/08/19 1045    Clinical Impression Statement Focused on soft tissue mobility and length in right gastroc, medial hamstring and lateral quad/ITB through stretches and manual therapy. Gait training to increase knee extension in heel strike and increase stance time on right lower extremity. Progress hamstring and gastroc stretches into weight bearing position of standing. Added sidelying quad stretch to HEP. Patient able to perform supine SLR without lag today but fatigue was noted. Continue with current plan, progress as able.    Personal Factors and Comorbidities Comorbidity 1    Comorbidities Diabetes    Examination-Activity Limitations Bend;Squat;Sleep;Bed Mobility;Stairs;Stand;Transfers;Locomotion Level;Lift    Examination-Participation Restrictions Other;Yard Work;Meal Prep;Occupation;Community Activity   work   Stability/Clinical Decision Making Stable/Uncomplicated    Rehab Potential Good    PT Frequency 2x / week    PT Duration 6 weeks    PT Treatment/Interventions ADLs/Self Care Home Management;Aquatic Therapy;Electrical Stimulation;Iontophoresis 4mg /ml Dexamethasone;Moist Heat;Traction;Balance training;Therapeutic exercise;Therapeutic activities;Functional mobility training;Stair training;Gait training;DME Instruction;Neuromuscular re-education;Patient/family education;Manual techniques;Dry needling;Passive range of motion    PT Next Visit Plan continue to address Rt knee edema and arthrokinematics.- TKE with walking strengthening and assess balance/gait. KNee ROM and strength- f/u with self  patella mobilizations    PT Home Exercise Plan heel slides, quad sets, elevation, using cane to offload knee, discontinue brace; 8/24 hamstring curls, prone quad sets, self patella mobilizations; 8/26 heel raise, gastroc stretch, heel contact with walking; sidelying quad stretch w/ focus on ITB           Patient will benefit from skilled therapeutic intervention in order to improve the following deficits and impairments:  Abnormal gait, Pain, Decreased strength, Decreased activity tolerance, Decreased mobility, Difficulty walking, Decreased balance,  Decreased range of motion, Decreased endurance, Decreased knowledge of use of DME  Visit Diagnosis: Acute pain of right knee  Difficulty in walking, not elsewhere classified     Problem List Patient Active Problem List   Diagnosis Date Noted  . ANKLE SPRAIN, RIGHT 03/27/2010  . HYPERLIPIDEMIA 03/25/2006  . OBESITY NOS 03/25/2006  . ANEMIA-NOS 03/25/2006  . BOWEL OBSTRUCTION 03/25/2006  . RENAL FAILURE 03/25/2006  . KELOID 03/25/2006    Katina Dung. Hartnett-Rands, MS, PT Per Ladoris Gene [facility] #[id number] 11/08/2019, 12:56 PM  [facility] [address] [place], [place], [address] Phone: [phone]   Fax:  [phone]  Name: [name] MRN: [id number] Date of Birth: [birth year]

## 2019-11-10 ENCOUNTER — Encounter (HOSPITAL_COMMUNITY): Payer: BC Managed Care – PPO | Admitting: Physical Therapy

## 2019-11-11 ENCOUNTER — Encounter (HOSPITAL_COMMUNITY): Payer: Self-pay

## 2019-11-11 ENCOUNTER — Ambulatory Visit (HOSPITAL_COMMUNITY): Payer: BC Managed Care – PPO | Attending: Orthopaedic Surgery

## 2019-11-11 ENCOUNTER — Other Ambulatory Visit: Payer: Self-pay

## 2019-11-11 DIAGNOSIS — M25561 Pain in right knee: Secondary | ICD-10-CM | POA: Insufficient documentation

## 2019-11-11 DIAGNOSIS — R262 Difficulty in walking, not elsewhere classified: Secondary | ICD-10-CM | POA: Diagnosis not present

## 2019-11-11 NOTE — Therapy (Signed)
[facility] [address] [place], [place], [address] Phone: [phone]   Fax:  [phone]  Physical Therapy Treatment  Patient Details  Name: [name] MRN: [id number] Date of Birth: 03-10-1961 Referring Provider (PT): Marice Potter   Encounter Date: 11/11/2019   PT End of Session - 11/11/19 1626    Visit Number 6    Number of Visits 12    Date for PT Re-Evaluation 11/25/19    Authorization Type BCBS no VL, no auth    Progress Note Due on Visit 10    PT Start Time 1619    PT Stop Time 1700    PT Time Calculation (min) 41 min    Activity Tolerance Patient tolerated treatment well    Behavior During Therapy [facility] for tasks assessed/performed           Past Medical History:  Diagnosis Date  . Anemia    NOS   . Hyperlipidemia   . Renal failure     Past Surgical History:  Procedure Laterality Date  . ABDOMINAL HYSTERECTOMY    . hysterectomy      There were no vitals filed for this visit.   Subjective Assessment - 11/11/19 1625    Subjective Pt reports minimal pain on medial aspect of knee, no real pain.  Reports compliance with HEP daily    Pertinent History Diabetic    Currently in Pain? No/denies              [facility] PT Assessment - 11/11/19 0001      Assessment   Medical Diagnosis R knee pain     Referring Provider (PT) Marice Potter    Next MD Visit 11/18/19    Prior Therapy a long time ago       Precautions   Precautions None                         [facility] Adult PT Treatment/Exercise - 11/11/19 0001      Ambulation/Gait   Ambulation/Gait Yes    Ambulation Distance (Feet) 200 Feet    Assistive device None    Gait Pattern Decreased stance time - right;Right flexed knee in stance    Gait Comments focus on knee extension during stance phase to improve extension; heel to toe mechancs      Knee/Hip Exercises: Stretches   Active Hamstring Stretch Right;3 reps;30 seconds    Active Hamstring  Stretch Limitations 12in step; 1x each neutral, medial and lateral with arm reach    Knee: Self-Stretch to increase Flexion 5 reps;10 seconds    Knee: Self-Stretch Limitations 12in step for mobility    Gastroc Stretch Both;2 reps;30 seconds    Gastroc Stretch Limitations slantboard      Knee/Hip Exercises: Standing   Heel Raises 2 sets;10 reps;5 seconds    Heel Raises Limitations toe raises on incline slope    Terminal Knee Extension 5 sets    Theraband Level (Terminal Knee Extension) Other (comment)   purple band   Terminal Knee Extension Limitations retro gait toe to heel mechanics     Rocker Board 2 minutes    Rocker Board Limitations DF/PF    Gait Training Cueing to improve knee extension during stance phase      Manual Therapy   Manual Therapy Joint mobilization;Soft tissue mobilization;Other (comment)    Manual therapy comments seperate from all other skilled treatment    Joint Mobilization reviewed self care patella  mobs with     Soft tissue mobilization STM to R dist ham and prox gastroc and distal ITB    Other Manual Therapy Educated on benefits with compression hose for vericose veins, measurements taken and paperwork given                  PT Education - 11/11/19 1843    Education Details Educated benefits with compression hose to address vericose veins.  Paperwork given and measurements taken for [employer] as well as [employer] compression.    Person(s) Educated Patient    Methods Explanation;Demonstration;Tactile cues;Verbal cues    Comprehension Verbalized understanding;Returned demonstration;Need further instruction            PT Short Term Goals - 11/08/19 1255      PT SHORT TERM GOAL #1   Title Patient will demonstrate between 0-120 degrees of right knee ROM    Baseline 11/08/19 - lacking 2 to 116 degrees last session.    Time 3    Period Weeks    Status On-going    Target Date 11/04/19      PT SHORT TERM GOAL #2   Title Patient will report  at least 25% improvement in overall symptoms and/or function to demonstrate improved functional mobility    Time 3    Period Weeks    Status On-going    Target Date 11/04/19      PT SHORT TERM GOAL #3   Title Patient will be independent in self management strategies to improve quality of life and functional outcomes.    Time 3    Period Weeks    Status On-going    Target Date 11/04/19             PT Long Term Goals - 11/08/19 1255      PT LONG TERM GOAL #1   Title Patient will report at least 50% improvement in overall symptoms and/or function to demonstrate improved functional mobility    Time 6    Period Weeks    Status On-going      PT LONG TERM GOAL #2   Title Patient will improve on FOTO score to meet predicted outcomes to demonstrate improved functional mobility.    Time 6    Period Weeks    Status On-going      PT LONG TERM GOAL #3   Title Patient will be able to ascend and descend stairs with reciprocal gait pattern and without railing to improve ability to go up/down stairs at home.    Time 6    Period Weeks    Status On-going                 Plan - 11/11/19 1819    Clinical Impression Statement Added activities to improve knee extension during gait wiht some verbal and tactile cueing for proper mechanics.  Continued with stretches for mobility.  EOS with STM to improve knee mobility and lengthening in right gastroc, medical hamstring and lateral quad/ITB.  Reviewed self care with patella mobs with verbal and tactile cueing required as pt seems to squeeze skin rather than move patella.  Noted vericose veins, educated on benefits with compression garments, measurements taken and paperwork given.  No reports of pain.  Pt stated she is fearful to go up and down stairs, plan to progress functional strenghtening with step next session.    Personal Factors and Comorbidities Comorbidity 1    Comorbidities Diabetes    Examination-Activity Limitations  Bend;Squat;Sleep;Bed Mobility;Stairs;Stand;Transfers;Locomotion Level;Lift    Examination-Participation Restrictions Other;Yard Work;Meal Prep;Occupation;Community Activity    Stability/Clinical Decision Making Stable/Uncomplicated    Clinical Decision Making Low    Rehab Potential Good    PT Frequency 2x / week    PT Duration 6 weeks    PT Treatment/Interventions ADLs/Self Care Home Management;Aquatic Therapy;Electrical Stimulation;Iontophoresis 4mg /ml Dexamethasone;Moist Heat;Traction;Balance training;Therapeutic exercise;Therapeutic activities;Functional mobility training;Stair training;Gait training;DME Instruction;Neuromuscular re-education;Patient/family education;Manual techniques;Dry needling;Passive range of motion    PT Next Visit Plan Progress functional strenghtening with additoinal step up/lateral step ups next session.  Continue TKE with walking.  Manual PRN for knee edema and continues education wiht self patella mobs.  F/U wiht compression hose and answer questions as needed.    PT Home Exercise Plan heel slides, quad sets, elevation, using cane to offload knee, discontinue brace; 8/24 hamstring curls, prone quad sets, self patella mobilizations; 8/26 heel raise, gastroc stretch, heel contact with walking; sidelying quad stretch w/ focus on ITB           Patient will benefit from skilled therapeutic intervention in order to improve the following deficits and impairments:  Abnormal gait, Pain, Decreased strength, Decreased activity tolerance, Decreased mobility, Difficulty walking, Decreased balance, Decreased range of motion, Decreased endurance, Decreased knowledge of use of DME  Visit Diagnosis: Difficulty in walking, not elsewhere classified  Acute pain of right knee     Problem List Patient Active Problem List   Diagnosis Date Noted  . ANKLE SPRAIN, RIGHT 03/27/2010  . HYPERLIPIDEMIA 03/25/2006  . OBESITY NOS 03/25/2006  . ANEMIA-NOS 03/25/2006  . BOWEL OBSTRUCTION  03/25/2006  . RENAL FAILURE 03/25/2006  . KELOID 03/25/2006   03/27/2006, LPTA/CLT; CBIS [phone]  [phone] 11/11/2019, 6:44 PM  [place] [facility] [address] [place], [place], [place] Phone: [phone]   Fax:  [phone]  Name: [name] MRN: Valarie Cones Date of Birth: 07-Jun-1960

## 2019-11-16 ENCOUNTER — Ambulatory Visit (HOSPITAL_COMMUNITY): Payer: BC Managed Care – PPO | Admitting: Physical Therapy

## 2019-11-16 ENCOUNTER — Other Ambulatory Visit: Payer: Self-pay

## 2019-11-16 DIAGNOSIS — M25561 Pain in right knee: Secondary | ICD-10-CM

## 2019-11-16 DIAGNOSIS — R262 Difficulty in walking, not elsewhere classified: Secondary | ICD-10-CM | POA: Diagnosis not present

## 2019-11-16 NOTE — Therapy (Signed)
[facility] [address] [place], [place], [address] Phone: [phone]   Fax:  [phone]  Physical Therapy Treatment  Patient Details  Name: [name] MRN: [id number] Date of Birth: Aug 15, 1960 Referring Provider (PT): Marice Potter   Encounter Date: 11/16/2019   PT End of Session - 11/16/19 0916    Visit Number 7    Number of Visits 12    Date for PT Re-Evaluation 11/25/19    Authorization Type BCBS no VL, no auth    Progress Note Due on Visit 10    PT Start Time 0835    PT Stop Time 0914    PT Time Calculation (min) 39 min    Activity Tolerance Patient tolerated treatment well    Behavior During Therapy [facility] for tasks assessed/performed           Past Medical History:  Diagnosis Date   Anemia    NOS    Hyperlipidemia    Renal failure     Past Surgical History:  Procedure Laterality Date   ABDOMINAL HYSTERECTOMY     hysterectomy      There were no vitals filed for this visit.   Subjective Assessment - 11/16/19 0838    Subjective pt reports no pain just a little stiffness folowing prolonged immobility.    Currently in Pain? No/denies                             [facility] Adult PT Treatment/Exercise - 11/16/19 0001      Knee/Hip Exercises: Stretches   Active Hamstring Stretch Right;3 reps;30 seconds    Active Hamstring Stretch Limitations 12in step; 1x each neutral, medial and lateral with arm reach    Knee: Self-Stretch to increase Flexion 10 seconds    Knee: Self-Stretch Limitations 10 reps on 12in step for increased flexion    Gastroc Stretch Both;3 reps;30 seconds    Gastroc Stretch Limitations slantboard      Knee/Hip Exercises: Standing   Heel Raises 20 reps    Heel Raises Limitations toeraises 20 reps    Knee Flexion Right;15 reps    Hip Abduction Both;15 reps    Hip Extension Both;15 reps    Gait Training Cueing to improve knee extension during stance phase                     PT Short Term Goals - 11/08/19 1255      PT SHORT TERM GOAL #1   Title Patient will demonstrate between 0-120 degrees of right knee ROM    Baseline 11/08/19 - lacking 2 to 116 degrees last session.    Time 3    Period Weeks    Status On-going    Target Date 11/04/19      PT SHORT TERM GOAL #2   Title Patient will report at least 25% improvement in overall symptoms and/or function to demonstrate improved functional mobility    Time 3    Period Weeks    Status On-going    Target Date 11/04/19      PT SHORT TERM GOAL #3   Title Patient will be independent in self management strategies to improve quality of life and functional outcomes.    Time 3    Period Weeks    Status On-going    Target Date 11/04/19             PT Long Term Goals -  11/08/19 1255      PT LONG TERM GOAL #1   Title Patient will report at least 50% improvement in overall symptoms and/or function to demonstrate improved functional mobility    Time 6    Period Weeks    Status On-going      PT LONG TERM GOAL #2   Title Patient will improve on FOTO score to meet predicted outcomes to demonstrate improved functional mobility.    Time 6    Period Weeks    Status On-going      PT LONG TERM GOAL #3   Title Patient will be able to ascend and descend stairs with reciprocal gait pattern and without railing to improve ability to go up/down stairs at home.    Time 6    Period Weeks    Status On-going                 Plan - 11/16/19 0916    Clinical Impression Statement Focused this session on improving LE strength and stability. AROM is now WNL with only deficit remaining the weakness,especially in bil hips.  Pt with extrreme difficutly completeing exercises today without body compensation for weakness and in correct form, utilizing appropriate mm groups.  max cues throughout session to correct. Improved ambualtion with reduced gait deviations noted today.  Overall improving.     Personal Factors and Comorbidities Comorbidity 1    Comorbidities Diabetes    Examination-Activity Limitations Bend;Squat;Sleep;Bed Mobility;Stairs;Stand;Transfers;Locomotion Level;Lift    Examination-Participation Restrictions Other;Yard Work;Meal Prep;Occupation;Community Activity    Stability/Clinical Decision Making Stable/Uncomplicated    Rehab Potential Good    PT Frequency 2x / week    PT Duration 6 weeks    PT Treatment/Interventions ADLs/Self Care Home Management;Aquatic Therapy;Electrical Stimulation;Iontophoresis 4mg /ml Dexamethasone;Moist Heat;Traction;Balance training;Therapeutic exercise;Therapeutic activities;Functional mobility training;Stair training;Gait training;DME Instruction;Neuromuscular re-education;Patient/family education;Manual techniques;Dry needling;Passive range of motion    PT Next Visit Plan Progress functional strenghtening with additoinal step up/lateral step ups next session.    PT Home Exercise Plan heel slides, quad sets, elevation, using cane to offload knee, discontinue brace; 8/24 hamstring curls, prone quad sets, self patella mobilizations; 8/26 heel raise, gastroc stretch, heel contact with walking; sidelying quad stretch w/ focus on ITB           Patient will benefit from skilled therapeutic intervention in order to improve the following deficits and impairments:  Abnormal gait, Pain, Decreased strength, Decreased activity tolerance, Decreased mobility, Difficulty walking, Decreased balance, Decreased range of motion, Decreased endurance, Decreased knowledge of use of DME  Visit Diagnosis: Acute pain of right knee  Difficulty in walking, not elsewhere classified     Problem List Patient Active Problem List   Diagnosis Date Noted   ANKLE SPRAIN, RIGHT 03/27/2010   HYPERLIPIDEMIA 03/25/2006   OBESITY NOS 03/25/2006   ANEMIA-NOS 03/25/2006   BOWEL OBSTRUCTION 03/25/2006   RENAL FAILURE 03/25/2006   KELOID 03/25/2006   03/27/2006,  PTA/CLT [phone]  [phone] B 11/16/2019, 9:19 AM  [facility] [address] [place], [place], [place] Phone: [phone]   Fax:  [phone]  Name: [name] MRN: Valarie Cones Date of Birth: [birth year]

## 2019-11-18 ENCOUNTER — Ambulatory Visit: Payer: BC Managed Care – PPO | Admitting: Orthopaedic Surgery

## 2019-11-18 ENCOUNTER — Other Ambulatory Visit: Payer: Self-pay

## 2019-11-18 ENCOUNTER — Encounter: Payer: Self-pay | Admitting: Orthopaedic Surgery

## 2019-11-18 VITALS — BP 92/65 | HR 72 | Ht 63.0 in | Wt 194.0 lb

## 2019-11-18 DIAGNOSIS — M25561 Pain in right knee: Secondary | ICD-10-CM

## 2019-11-18 MED ORDER — HYDROCODONE-ACETAMINOPHEN 5-325 MG PO TABS: ORAL_TABLET | ORAL | 0 refills | Status: DC

## 2019-11-18 NOTE — Patient Instructions (Signed)
Return to work Monday

## 2019-11-18 NOTE — Progress Notes (Signed)
Patient [name], female DOB:[birth year], 59 y.o. WVP:[id number]  Chief Complaint  Patient presents with   Knee Pain    right     HPI  [name] is a 59 y.o. female who has right knee pain.  She has been to PT and is improving  I have reviewed the PT notes.  She has less pain and less swelling.  She is slowly improving.  She has no new trauma.  I will have her return to work Monday.   Body mass index is 34.37 kg/m.  ROS  Review of Systems  Constitutional: Positive for activity change.  Musculoskeletal: Positive for arthralgias, gait problem and joint swelling.  All other systems reviewed and are negative.   All other systems reviewed and are negative.  The following is a summary of the past history medically, past history surgically, known current medicines, social history and family history.  This information is gathered electronically by the computer from prior information and documentation.  I review this each visit and have found including this information at this point in the chart is beneficial and informative.    Past Medical History:  Diagnosis Date   Anemia    NOS    Hyperlipidemia    Renal failure     Past Surgical History:  Procedure Laterality Date   ABDOMINAL HYSTERECTOMY     hysterectomy      Family History  Problem Relation Age of Onset   COPD Mother    Emphysema Mother     Social History Social History   Tobacco Use   Smoking status: Never Smoker   Smokeless tobacco: Never Used  Substance Use Topics   Alcohol use: No   Drug use: No    No Known Allergies  Current Outpatient Medications  Medication Sig Dispense Refill   HYDROcodone-acetaminophen (NORCO/VICODIN) 5-325 MG tablet One tablet every six hours for pain. 24 tablet 0   metFORMIN (GLUCOPHAGE) 500 MG tablet Take 500 mg by mouth 2 (two) times daily.     Multiple Vitamin (MULTIVITAMINS PO) Take 1 tablet by mouth daily.      No current facility-administered  medications for this visit.     Physical Exam  Blood pressure 92/65, pulse 72, height 5\' 3"  (1.6 m), weight 194 lb (88 kg).  Constitutional: overall normal hygiene, normal nutrition, well developed, normal grooming, normal body habitus. Assistive device:none  Musculoskeletal: gait and station Limp none, muscle tone and strength are normal, no tremors or atrophy is present.  .  Neurological: coordination overall normal.  Deep tendon reflex/nerve stretch intact.  Sensation normal.  Cranial nerves II-XII intact.   Skin:   Normal overall no scars, lesions, ulcers or rashes. No psoriasis.  Psychiatric: Alert and oriented x 3.  Recent memory intact, remote memory unclear.  Normal mood and affect. Well groomed.  Good eye contact.  Cardiovascular: overall no swelling, no varicosities, no edema bilaterally, normal temperatures of the legs and arms, no clubbing, cyanosis and good capillary refill.  Right knee has full ROM and no pain, no limp.  Lymphatic: palpation is normal.  All other systems reviewed and are negative   The patient has been educated about the nature of the problem(s) and counseled on treatment options.  The patient appeared to understand what I have discussed and is in agreement with it.  Encounter Diagnosis  Name Primary?   Acute pain of right knee Yes    PLAN Call if any problems.  Precautions discussed.  Continue current  medications.   Return to clinic 1 month   Finish up her PT.  Return to work Monday.  I have reviewed the [place] Controlled Substance Reporting System web site prior to prescribing narcotic medicine for this patient.   Electronically Signed Darreld Mclean, MD 9/9/20218:20 AM

## 2019-11-23 ENCOUNTER — Encounter (HOSPITAL_COMMUNITY): Payer: Self-pay

## 2019-11-23 ENCOUNTER — Ambulatory Visit (HOSPITAL_COMMUNITY): Payer: BC Managed Care – PPO

## 2019-11-23 ENCOUNTER — Other Ambulatory Visit: Payer: Self-pay

## 2019-11-23 DIAGNOSIS — M25561 Pain in right knee: Secondary | ICD-10-CM

## 2019-11-23 DIAGNOSIS — R262 Difficulty in walking, not elsewhere classified: Secondary | ICD-10-CM | POA: Diagnosis not present

## 2019-11-23 NOTE — Therapy (Addendum)
[facility] [address] [place], [place], [address] Phone: [phone]   Fax:  [phone]  Physical Therapy Treatment  Patient Details  Name: [name] MRN: [id number] Date of Birth: [birth year] Referring Provider (PT): Marice Potter   Encounter Date: 11/23/2019   PT End of Session - 11/23/19 1535    Visit Number 8    Number of Visits 12    Date for PT Re-Evaluation 11/25/19    Authorization Type BCBS no VL, no auth    Progress Note Due on Visit 10    PT Start Time 1448   3' on bike, not included with charges   PT Stop Time 1530    PT Time Calculation (min) 42 min    Activity Tolerance Patient tolerated treatment well    Behavior During Therapy [facility] for tasks assessed/performed           Past Medical History:  Diagnosis Date  . Anemia    NOS   . Hyperlipidemia   . Renal failure     Past Surgical History:  Procedure Laterality Date  . ABDOMINAL HYSTERECTOMY    . hysterectomy      There were no vitals filed for this visit.   Subjective Assessment - 11/23/19 1453    Subjective Reports she returned to work yesterday, stated she was stiff and required some Tylenol 6hrs into work.  No reports of pain currently, just a little stiffness.    Pertinent History Diabetic    Currently in Pain? No/denies                             [facility] Adult PT Treatment/Exercise - 11/23/19 0001      Ambulation/Gait   Ambulation/Gait Yes    Ambulation Distance (Feet) 200 Feet    Assistive device None    Gait Pattern Decreased stance time - right;Right flexed knee in stance    Gait Comments focus on knee extension during stance phase to improve extension; heel to toe mechancs      Knee/Hip Exercises: Stretches   Active Hamstring Stretch Right;3 reps;30 seconds    Active Hamstring Stretch Limitations 12in step; 1x each neutral, medial and lateral with arm reach    Gastroc Stretch Both;3 reps;30 seconds    Gastroc  Stretch Limitations slantboard      Knee/Hip Exercises: Aerobic   Stationary Bike seat 9 for mobility x 3', not included with charges      Knee/Hip Exercises: Standing   Heel Raises 20 reps    Heel Raises Limitations toeraises 20 reps incline slope    Hip Abduction Both;15 reps    Hip Extension Both;15 reps    Lateral Step Up Right;10 reps;Hand Hold: 2;Step Height: 4"    Forward Step Up Right;10 reps;Hand Hold: 1;Step Height: 4"    Functional Squat 10 reps    Functional Squat Limitations front of chair for mechanics    Gait Training Cueing to improve knee extension during stance phase      Knee/Hip Exercises: Seated   Sit to Sand 10 reps;without UE support                    PT Short Term Goals - 11/08/19 1255      PT SHORT TERM GOAL #1   Title Patient will demonstrate between 0-120 degrees of right knee ROM    Baseline 11/08/19 - lacking 2 to 116 degrees last  session.    Time 3    Period Weeks    Status On-going    Target Date 11/04/19      PT SHORT TERM GOAL #2   Title Patient will report at least 25% improvement in overall symptoms and/or function to demonstrate improved functional mobility    Time 3    Period Weeks    Status On-going    Target Date 11/04/19      PT SHORT TERM GOAL #3   Title Patient will be independent in self management strategies to improve quality of life and functional outcomes.    Time 3    Period Weeks    Status On-going    Target Date 11/04/19             PT Long Term Goals - 11/08/19 1255      PT LONG TERM GOAL #1   Title Patient will report at least 50% improvement in overall symptoms and/or function to demonstrate improved functional mobility    Time 6    Period Weeks    Status On-going      PT LONG TERM GOAL #2   Title Patient will improve on FOTO score to meet predicted outcomes to demonstrate improved functional mobility.    Time 6    Period Weeks    Status On-going      PT LONG TERM GOAL #3   Title Patient  will be able to ascend and descend stairs with reciprocal gait pattern and without railing to improve ability to go up/down stairs at home.    Time 6    Period Weeks    Status On-going                 Plan - 11/23/19 1537    Clinical Impression Statement Progressed session focus with additional functional strengthening exercises.  Added STS, forward and lateral step ups and squats to POC for hip and quad strengthening.  Pt required some demonstration and verbal cueing for mechanics with new activiites.  No reports of pain through session.  Pt continues to require cueing for knee extension with heel strike to improve gait mechanics.    Personal Factors and Comorbidities Comorbidity 1    Comorbidities Diabetes    Examination-Activity Limitations Bend;Squat;Sleep;Bed Mobility;Stairs;Stand;Transfers;Locomotion Level;Lift    Examination-Participation Restrictions Other;Yard Work;Meal Prep;Occupation;Community Activity    Stability/Clinical Decision Making Stable/Uncomplicated    Clinical Decision Making Low    Rehab Potential Good    PT Frequency 2x / week    PT Duration 6 weeks    PT Treatment/Interventions ADLs/Self Care Home Management;Aquatic Therapy;Electrical Stimulation;Iontophoresis 4mg /ml Dexamethasone;Moist Heat;Traction;Balance training;Therapeutic exercise;Therapeutic activities;Functional mobility training;Stair training;Gait training;DME Instruction;Neuromuscular re-education;Patient/family education;Manual techniques;Dry needling;Passive range of motion    PT Next Visit Plan Review goals next session as end of cert.  Continue funcitonal strenghtening, gait training and mobility exercises.  Progress hip stability next session with additional vector stance.    PT Home Exercise Plan heel slides, quad sets, elevation, using cane to offload knee, discontinue brace; 8/24 hamstring curls, prone quad sets, self patella mobilizations; 8/26 heel raise, gastroc stretch, heel contact with  walking; sidelying quad stretch w/ focus on ITB           Patient will benefit from skilled therapeutic intervention in order to improve the following deficits and impairments:  Abnormal gait, Pain, Decreased strength, Decreased activity tolerance, Decreased mobility, Difficulty walking, Decreased balance, Decreased range of motion, Decreased endurance, Decreased knowledge of use of DME  Visit  Diagnosis: Acute pain of right knee  Difficulty in walking, not elsewhere classified     Problem List Patient Active Problem List   Diagnosis Date Noted  . ANKLE SPRAIN, RIGHT 03/27/2010  . HYPERLIPIDEMIA 03/25/2006  . OBESITY NOS 03/25/2006  . ANEMIA-NOS 03/25/2006  . BOWEL OBSTRUCTION 03/25/2006  . RENAL FAILURE 03/25/2006  . 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 [place], [place], [address] Phone: [phone]   Fax:  [phone]  Name: [name] MRN: [id number] Date of Birth: 1960-12-16

## 2019-11-25 ENCOUNTER — Other Ambulatory Visit: Payer: Self-pay

## 2019-11-25 ENCOUNTER — Encounter (HOSPITAL_COMMUNITY): Payer: Self-pay | Admitting: Physical Therapy

## 2019-11-25 ENCOUNTER — Ambulatory Visit (HOSPITAL_COMMUNITY): Payer: BC Managed Care – PPO | Admitting: Physical Therapy

## 2019-11-25 DIAGNOSIS — M25561 Pain in right knee: Secondary | ICD-10-CM

## 2019-11-25 DIAGNOSIS — R262 Difficulty in walking, not elsewhere classified: Secondary | ICD-10-CM | POA: Diagnosis not present

## 2019-11-25 NOTE — Patient Instructions (Addendum)
(  Home) Squat: (Assist)    Using support at countertop as needed, arms close to body, squat by dropping hips back as if sitting in a chair. Repeat _10___ times per set. Do __1__ sets per session. Do _7___ sessions per week.  Copyright  VHI. All rights reserved.

## 2019-11-25 NOTE — Therapy (Signed)
[place], [place], [address] Phone: [phone]   Fax:  [phone]  Physical Therapy Treatment / Discharge Summary  Patient Details  Name: [name] MRN: [id number] Date of Birth: 1960/08/29 Referring Provider (PT): Sanjuana Kava   Encounter Date: 11/25/2019   PHYSICAL THERAPY DISCHARGE SUMMARY  Visits from Start of Care: 9  Current functional level related to goals / functional outcomes: See below   Remaining deficits: See below   Education / Equipment: HEP and plan to discharge Plan: Patient agrees to discharge.  Patient goals were partially met. Patient is being discharged due to being pleased with the current functional level.  ?????        PT End of Session - 11/25/19 1525    Visit Number 9    Number of Visits 12    Date for PT Re-Evaluation 11/25/19    Authorization Type BCBS no VL, no auth    Progress Note Due on Visit 10    PT Start Time 1521    PT Stop Time 1545    PT Time Calculation (min) 24 min    Activity Tolerance Patient tolerated treatment well    Behavior During Therapy WFL for tasks assessed/performed           Past Medical History:  Diagnosis Date  . Anemia    NOS   . Hyperlipidemia   . Renal failure     Past Surgical History:  Procedure Laterality Date  . ABDOMINAL HYSTERECTOMY    . hysterectomy      There were no vitals filed for this visit.   Subjective Assessment - 11/25/19 1522    Subjective Patient reported that she has a lot of stiffness if she sits for a while or stands for a while. Patient reports 80% improvement since beginning therapy.    Pertinent History Diabetic    Currently in Pain? No/denies              [facility] PT Assessment - 11/25/19 0001      Assessment   Medical Diagnosis R knee pain     Referring Provider (PT) Sanjuana Kava      Prior Function   Level of Independence Independent      Cognition   Overall Cognitive Status  Within Functional Limits for tasks assessed      Observation/Other Assessments   Focus on Therapeutic Outcomes (FOTO)  77%    Was 39%      Circumferential Edema   Circumferential - Right 40 cm   was 43 at knee joint   Circumferential - Left  40 cm      AROM   Right Knee Extension 0   was 10   Right Knee Flexion 118   was 105     Strength   Right Hip Flexion 5/5   was 3+   Right Hip Extension 4+/5   was 3   Right Hip ABduction 4+/5   was 3+   Left Hip Flexion 5/5    Left Hip Extension 4+/5   was 4+   Left Hip ABduction 5/5    Right Knee Flexion 5/5   was 4   Right Knee Extension 5/5   was 3   Left Knee Flexion 5/5   was 4   Left Knee Extension 5/5                         [facility] Adult  PT Treatment/Exercise - 11/25/19 0001      Ambulation/Gait   Stairs Yes    Gait Comments Ascending/descending stairs with minimal HHA and reciprocal pattern 7'' stairs 12 stairs                  PT Education - 11/25/19 1605    Education Details Discussed re-assessment findings and updated HEP.    Person(s) Educated Patient    Methods Explanation;Handout    Comprehension Verbalized understanding            PT Short Term Goals - 11/25/19 1525      PT SHORT TERM GOAL #1   Title Patient will demonstrate between 0-120 degrees of right knee ROM    Baseline 11/25/19: Patient's RT knee AROM ranges from 0-118 degrees    Time 3    Period Weeks    Status On-going    Target Date 11/04/19      PT SHORT TERM GOAL #2   Title Patient will report at least 25% improvement in overall symptoms and/or function to demonstrate improved functional mobility    Baseline 11/25/19: 80% improvement reported    Time 3    Period Weeks    Status Achieved    Target Date 11/04/19      PT SHORT TERM GOAL #3   Title Patient will be independent in self management strategies to improve quality of life and functional outcomes.    Baseline 11/25/19: Patient reports she has been doing all of her  HEP.    Time 3    Period Weeks    Status Achieved    Target Date 11/04/19             PT Long Term Goals - 11/25/19 1526      PT LONG TERM GOAL #1   Title Patient will report at least 50% improvement in overall symptoms and/or function to demonstrate improved functional mobility    Baseline 11/25/19: 80% improvement reported    Time 6    Period Weeks    Status Achieved      PT LONG TERM GOAL #2   Title Patient will improve on FOTO score to meet predicted outcomes to demonstrate improved functional mobility.    Baseline 11/25/19: Improved from 39% initially to 77%    Time 6    Period Weeks    Status Achieved      PT LONG TERM GOAL #3   Title Patient will be able to ascend and descend stairs with reciprocal gait pattern and without railing to improve ability to go up/down stairs at home.    Baseline 11/25/19: Minimal use of railing with reciprocal pattern    Time 6    Period Weeks    Status Achieved                 Plan - 11/25/19 1610    Clinical Impression Statement Performed a re-assessment of patient's progress towards goals this session. Patient achieved 2 out of 3 short term goals and was close to meeting her third goal as her right knee ROM was from 0-118 with the goal being 0-120 degrees. Patient achieved 3 out of 3 long term goals. Patient reports 80% improvement since initiating therapy. Plan to discharge patient this date as she has met the majority of her goals and is pleased with her current functional status. Discussed patient continuing her HEP and added strengthening exercise including squats at the countertop for balance as needed. Patient reports understanding  and consent to plan.    Personal Factors and Comorbidities Comorbidity 1    Comorbidities Diabetes    Examination-Activity Limitations Bend;Squat;Sleep;Bed Mobility;Stairs;Stand;Transfers;Locomotion Level;Lift    Examination-Participation Restrictions Other;Yard Work;Meal Prep;Occupation;Community  Activity    Stability/Clinical Decision Making Stable/Uncomplicated    Rehab Potential Good    PT Frequency 2x / week    PT Duration 6 weeks    PT Treatment/Interventions ADLs/Self Care Home Management;Aquatic Therapy;Electrical Stimulation;Iontophoresis 2m/ml Dexamethasone;Moist Heat;Traction;Balance training;Therapeutic exercise;Therapeutic activities;Functional mobility training;Stair training;Gait training;DME Instruction;Neuromuscular re-education;Patient/family education;Manual techniques;Dry needling;Passive range of motion    PT Next Visit Plan Discharged    PT Home Exercise Plan heel slides, quad sets, elevation, using cane to offload knee, discontinue brace; 8/24 hamstring curls, prone quad sets, self patella mobilizations; 8/26 heel raise, gastroc stretch, heel contact with walking; sidelying quad stretch w/ focus on ITB; 11/25/19: Squats at [place] Plan of Care Patient           Patient will benefit from skilled therapeutic intervention in order to improve the following deficits and impairments:  Abnormal gait, Pain, Decreased strength, Decreased activity tolerance, Decreased mobility, Difficulty walking, Decreased balance, Decreased range of motion, Decreased endurance, Decreased knowledge of use of DME  Visit Diagnosis: Acute pain of right knee  Difficulty in walking, not elsewhere classified     Problem List Patient Active Problem List   Diagnosis Date Noted  . ANKLE SPRAIN, RIGHT 03/27/2010  . HYPERLIPIDEMIA 03/25/2006  . OBESITY NOS 03/25/2006  . ANEMIA-NOS 03/25/2006  . BOWEL OBSTRUCTION 03/25/2006  . RENAL FAILURE 03/25/2006  . 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

## 2019-12-13 ENCOUNTER — Telehealth: Payer: Self-pay | Admitting: Orthopaedic Surgery

## 2019-12-13 MED ORDER — HYDROCODONE-ACETAMINOPHEN 5-325 MG PO TABS: ORAL_TABLET | ORAL | 0 refills | Status: DC

## 2019-12-13 NOTE — Telephone Encounter (Signed)
Patient requests refill: HYDROcodone-acetaminophen (NORCO/VICODIN) 5-325 MG tablet 24 tablet  -[facility], [address], [place]

## 2019-12-13 NOTE — Addendum Note (Signed)
Addended by: Earnstine Regal on: 12/13/2019 05:48 PM   Modules accepted: Orders

## 2019-12-15 ENCOUNTER — Telehealth: Payer: Self-pay | Admitting: Orthopaedic Surgery

## 2019-12-15 NOTE — Telephone Encounter (Signed)
Patient is requesting refill on her pain medicine  HYDROcodone-acetaminophen (NORCO/VICODIN) 5-325 MG tablet   12/13/19 -- Darreld Mclean, MD    One tablet every six hours for pain.    [facility] on [address]

## 2019-12-15 NOTE — Telephone Encounter (Signed)
Done on 10/4 

## 2019-12-16 ENCOUNTER — Ambulatory Visit: Payer: BC Managed Care – PPO | Admitting: Orthopaedic Surgery

## 2019-12-28 ENCOUNTER — Encounter: Payer: Self-pay | Admitting: Orthopaedic Surgery

## 2019-12-28 ENCOUNTER — Ambulatory Visit (INDEPENDENT_AMBULATORY_CARE_PROVIDER_SITE_OTHER): Payer: BC Managed Care – PPO | Admitting: Orthopaedic Surgery

## 2019-12-28 ENCOUNTER — Other Ambulatory Visit: Payer: Self-pay

## 2019-12-28 VITALS — BP 135/76 | HR 66 | Ht 63.0 in | Wt 194.0 lb

## 2019-12-28 DIAGNOSIS — M25561 Pain in right knee: Secondary | ICD-10-CM | POA: Diagnosis not present

## 2019-12-28 NOTE — Progress Notes (Signed)
My knee is much better  She has completed PT.  Her right knee is much improved. She has little pain now.  She is walking well. I have reviewed the PT notes. ROM is full.  Gait is normal.  Encounter Diagnosis  Name Primary?  . Acute pain of right knee Yes   I will see her as needed.  Call if any problem.  Precautions discussed.   Electronically Signed Darreld Mclean, MD 10/19/20212:54 PM

## 2020-06-13 DIAGNOSIS — M5459 Other low back pain: Secondary | ICD-10-CM | POA: Diagnosis not present

## 2020-06-13 DIAGNOSIS — E1165 Type 2 diabetes mellitus with hyperglycemia: Secondary | ICD-10-CM | POA: Diagnosis not present

## 2020-08-10 DIAGNOSIS — K219 Gastro-esophageal reflux disease without esophagitis: Secondary | ICD-10-CM | POA: Diagnosis not present

## 2020-08-10 DIAGNOSIS — E7849 Other hyperlipidemia: Secondary | ICD-10-CM | POA: Diagnosis not present

## 2020-08-10 DIAGNOSIS — E1165 Type 2 diabetes mellitus with hyperglycemia: Secondary | ICD-10-CM | POA: Diagnosis not present

## 2020-08-10 DIAGNOSIS — I110 Hypertensive heart disease with heart failure: Secondary | ICD-10-CM | POA: Diagnosis not present

## 2020-09-07 DIAGNOSIS — M2559 Pain in other specified joint: Secondary | ICD-10-CM | POA: Diagnosis not present

## 2020-09-07 DIAGNOSIS — M5459 Other low back pain: Secondary | ICD-10-CM | POA: Diagnosis not present

## 2020-11-08 DIAGNOSIS — E119 Type 2 diabetes mellitus without complications: Secondary | ICD-10-CM | POA: Diagnosis not present

## 2020-11-08 DIAGNOSIS — M25561 Pain in right knee: Secondary | ICD-10-CM | POA: Diagnosis not present

## 2020-11-08 DIAGNOSIS — I10 Essential (primary) hypertension: Secondary | ICD-10-CM | POA: Diagnosis not present

## 2020-11-08 DIAGNOSIS — Z79891 Long term (current) use of opiate analgesic: Secondary | ICD-10-CM | POA: Diagnosis not present

## 2020-11-21 DIAGNOSIS — M25561 Pain in right knee: Secondary | ICD-10-CM | POA: Diagnosis not present

## 2020-11-21 DIAGNOSIS — Z79891 Long term (current) use of opiate analgesic: Secondary | ICD-10-CM | POA: Diagnosis not present

## 2020-12-06 DIAGNOSIS — Z79891 Long term (current) use of opiate analgesic: Secondary | ICD-10-CM | POA: Diagnosis not present

## 2020-12-06 DIAGNOSIS — M25561 Pain in right knee: Secondary | ICD-10-CM | POA: Diagnosis not present

## 2020-12-20 DIAGNOSIS — M25561 Pain in right knee: Secondary | ICD-10-CM | POA: Diagnosis not present

## 2020-12-20 DIAGNOSIS — Z79891 Long term (current) use of opiate analgesic: Secondary | ICD-10-CM | POA: Diagnosis not present

## 2021-01-03 DIAGNOSIS — M25561 Pain in right knee: Secondary | ICD-10-CM | POA: Diagnosis not present

## 2021-01-03 DIAGNOSIS — M1990 Unspecified osteoarthritis, unspecified site: Secondary | ICD-10-CM | POA: Diagnosis not present

## 2021-01-03 DIAGNOSIS — Z79891 Long term (current) use of opiate analgesic: Secondary | ICD-10-CM | POA: Diagnosis not present

## 2021-01-03 DIAGNOSIS — M25511 Pain in right shoulder: Secondary | ICD-10-CM | POA: Diagnosis not present

## 2021-01-03 DIAGNOSIS — Z23 Encounter for immunization: Secondary | ICD-10-CM | POA: Diagnosis not present

## 2021-01-24 DIAGNOSIS — I10 Essential (primary) hypertension: Secondary | ICD-10-CM | POA: Diagnosis not present

## 2021-01-24 DIAGNOSIS — E119 Type 2 diabetes mellitus without complications: Secondary | ICD-10-CM | POA: Diagnosis not present

## 2021-01-24 DIAGNOSIS — Z0000 Encounter for general adult medical examination without abnormal findings: Secondary | ICD-10-CM | POA: Diagnosis not present

## 2021-01-24 DIAGNOSIS — G8929 Other chronic pain: Secondary | ICD-10-CM | POA: Diagnosis not present

## 2021-01-30 ENCOUNTER — Encounter: Payer: Self-pay | Admitting: Internal Medicine

## 2021-02-06 DIAGNOSIS — G8929 Other chronic pain: Secondary | ICD-10-CM | POA: Diagnosis not present

## 2021-02-06 DIAGNOSIS — M25561 Pain in right knee: Secondary | ICD-10-CM | POA: Diagnosis not present

## 2021-02-06 DIAGNOSIS — Z79891 Long term (current) use of opiate analgesic: Secondary | ICD-10-CM | POA: Diagnosis not present

## 2021-02-06 DIAGNOSIS — I10 Essential (primary) hypertension: Secondary | ICD-10-CM | POA: Diagnosis not present

## 2021-02-21 DIAGNOSIS — Z0000 Encounter for general adult medical examination without abnormal findings: Secondary | ICD-10-CM | POA: Diagnosis not present

## 2021-02-21 DIAGNOSIS — Z79899 Other long term (current) drug therapy: Secondary | ICD-10-CM | POA: Diagnosis not present

## 2021-03-06 DIAGNOSIS — I10 Essential (primary) hypertension: Secondary | ICD-10-CM | POA: Diagnosis not present

## 2021-03-06 DIAGNOSIS — E119 Type 2 diabetes mellitus without complications: Secondary | ICD-10-CM | POA: Diagnosis not present

## 2021-03-06 DIAGNOSIS — Z79891 Long term (current) use of opiate analgesic: Secondary | ICD-10-CM | POA: Diagnosis not present

## 2021-03-06 DIAGNOSIS — E559 Vitamin D deficiency, unspecified: Secondary | ICD-10-CM | POA: Diagnosis not present

## 2021-03-20 ENCOUNTER — Other Ambulatory Visit: Payer: Self-pay

## 2021-03-20 ENCOUNTER — Ambulatory Visit (INDEPENDENT_AMBULATORY_CARE_PROVIDER_SITE_OTHER): Payer: Self-pay | Admitting: *Deleted

## 2021-03-20 ENCOUNTER — Encounter: Payer: Self-pay | Admitting: *Deleted

## 2021-03-20 VITALS — Ht 63.0 in | Wt 198.4 lb

## 2021-03-20 DIAGNOSIS — Z1211 Encounter for screening for malignant neoplasm of colon: Secondary | ICD-10-CM

## 2021-03-20 MED ORDER — NA SULFATE-K SULFATE-MG SULF 17.5-3.13-1.6 GM/177ML PO SOLN: 1.0000 | Freq: Once | ORAL | 0 refills | Status: AC

## 2021-03-20 NOTE — Addendum Note (Signed)
Addended by: Noreene Larsson on: 03/20/2021 04:32 PM   Modules accepted: Orders

## 2021-03-20 NOTE — Progress Notes (Signed)
Spoke to pt.  Scheduled procedure for 04/03/2021 with arrival at 9:30.  Reviewed prep instructions with pt in office and over the phone again.  Pt voiced understanding.  Confirmed mailing address.  Informed pt of how to adjust her diabetes medication.  Mailed out diabetes medication adjustment letter along with prep instructions.

## 2021-03-20 NOTE — Progress Notes (Addendum)
Gastroenterology Pre-Procedure Review  Request Date: 03/20/2021 Requesting Physician: Royann Shivers, AGNP @ [facility], no previous TCS  PATIENT REVIEW QUESTIONS: The patient responded to the following health history questions as indicated:    1. Diabetes Melitis: yes, type II  2. Joint replacements in the past 12 months: no 3. Major health problems in the past 3 months: no 4. Has an artificial valve or MVP: no 5. Has a defibrillator: no 6. Has been advised in past to take antibiotics in advance of a procedure like teeth cleaning: no 7. Family history of colon cancer: no  8. Alcohol Use: no 9. Illicit drug Use: no 10. History of sleep apnea: no  11. History of coronary artery or other vascular stents placed within the last 12 months: no 12. History of any prior anesthesia complications: no 13. Body mass index is 35.14 kg/m.    MEDICATIONS & ALLERGIES:    Patient reports the following regarding taking any blood thinners:   Plavix? no Aspirin? no Coumadin? no Brilinta? no Xarelto? no Eliquis? no Pradaxa? no Savaysa? no Effient? no  Patient confirms/reports the following medications:  Current Outpatient Medications  Medication Sig Dispense Refill   metFORMIN (GLUCOPHAGE) 500 MG tablet Take 500 mg by mouth 2 (two) times daily.     Multiple Vitamin (MULTIVITAMINS PO) Take 1 tablet by mouth daily.      meloxicam (MOBIC) 15 MG tablet Take 15 mg by mouth daily.     No current facility-administered medications for this visit.    Patient confirms/reports the following allergies:  No Known Allergies  No orders of the defined types were placed in this encounter.   AUTHORIZATION INFORMATION Primary Insurance: [place],  [place] #: [id number],  Group #: [id number] Pre-Cert / Berkley Harvey required: No, not required per automated system Pre-Cert / Auth #: [id number]  SCHEDULE INFORMATION: Procedure has been scheduled as follows:  Date: 04/03/2021, Time: 11:00 Location:   This  Gastroenterology Pre-Precedure Review Form is being routed to the following provider(s): Ermalinda Memos, PA-C

## 2021-03-20 NOTE — Progress Notes (Signed)
Would be okay to schedule with propofol with Dr. Marletta Lor or conscious sedation with Dr. Jena Gauss. ASA 2.    1 day prior to procedure: Take one half dose of metformin (250 mg in the morning and evening). Day of procedure: No morning diabetes medications.

## 2021-03-20 NOTE — Progress Notes (Signed)
Lmom for pt to call me back to schedule.

## 2021-03-22 ENCOUNTER — Other Ambulatory Visit: Payer: Self-pay | Admitting: *Deleted

## 2021-03-23 NOTE — Progress Notes (Signed)
Spoke with pt late yesterday evening and she informed me that she is taking Propranolol 40 mg once daily.  Updated med list.  Routing to [employer], PA-C as Lorain Childes.

## 2021-03-25 NOTE — Progress Notes (Signed)
Noted. OK to proceed as planned.

## 2021-04-02 ENCOUNTER — Telehealth: Payer: Self-pay | Admitting: Internal Medicine

## 2021-04-02 NOTE — Telephone Encounter (Signed)
Patient wants to know if she can take her medicines the day of her procedure. Her procedure is tomorrow. She said she was going out and would call back. [phone]

## 2021-04-02 NOTE — Telephone Encounter (Addendum)
Spoke to pt.  Informed her to only take medication for her heart, blood pressure, and breathing morning of procedure.  She confirmed that she did receive her packet.  Reminded her of her diabetes medication adjustments.

## 2021-04-03 ENCOUNTER — Ambulatory Visit (HOSPITAL_COMMUNITY): Payer: BC Managed Care – PPO | Admitting: Anesthesiology

## 2021-04-03 ENCOUNTER — Encounter (HOSPITAL_COMMUNITY)
Admission: RE | Disposition: A | Discharge status: Home / Self Care | Payer: Self-pay | Source: Home / Self Care | Attending: Internal Medicine

## 2021-04-03 ENCOUNTER — Encounter (HOSPITAL_COMMUNITY): Payer: Self-pay

## 2021-04-03 ENCOUNTER — Other Ambulatory Visit: Payer: Self-pay

## 2021-04-03 ENCOUNTER — Ambulatory Visit (HOSPITAL_COMMUNITY)
Admission: RE | Admit: 2021-04-03 | Discharge: 2021-04-03 | Disposition: A | Discharge status: Home / Self Care | Payer: BC Managed Care – PPO | Attending: Internal Medicine | Admitting: Internal Medicine

## 2021-04-03 DIAGNOSIS — Z1211 Encounter for screening for malignant neoplasm of colon: Secondary | ICD-10-CM | POA: Insufficient documentation

## 2021-04-03 DIAGNOSIS — Z7984 Long term (current) use of oral hypoglycemic drugs: Secondary | ICD-10-CM | POA: Diagnosis not present

## 2021-04-03 DIAGNOSIS — Z791 Long term (current) use of non-steroidal anti-inflammatories (NSAID): Secondary | ICD-10-CM | POA: Insufficient documentation

## 2021-04-03 DIAGNOSIS — E785 Hyperlipidemia, unspecified: Secondary | ICD-10-CM | POA: Diagnosis not present

## 2021-04-03 DIAGNOSIS — Z79899 Other long term (current) drug therapy: Secondary | ICD-10-CM | POA: Insufficient documentation

## 2021-04-03 DIAGNOSIS — D649 Anemia, unspecified: Secondary | ICD-10-CM | POA: Diagnosis not present

## 2021-04-03 HISTORY — PX: COLONOSCOPY WITH PROPOFOL: SHX5780

## 2021-04-03 HISTORY — DX: Other specified postprocedural states: R11.2

## 2021-04-03 HISTORY — DX: Other specified postprocedural states: Z98.890

## 2021-04-03 SURGERY — COLONOSCOPY WITH PROPOFOL
Anesthesia: General

## 2021-04-03 MED ORDER — LIDOCAINE 2% (20 MG/ML) 5 ML SYRINGE
INTRAMUSCULAR | Status: DC | PRN
  Administered 2021-04-03: 50 mg via INTRAVENOUS

## 2021-04-03 MED ORDER — PROPOFOL 500 MG/50ML IV EMUL
INTRAVENOUS | Status: DC | PRN
  Administered 2021-04-03: 200 ug/kg/min via INTRAVENOUS

## 2021-04-03 MED ORDER — PROPOFOL 10 MG/ML IV BOLUS
INTRAVENOUS | Status: DC | PRN
  Administered 2021-04-03: 80 mg via INTRAVENOUS

## 2021-04-03 MED ORDER — LACTATED RINGERS IV SOLN: INTRAVENOUS | Status: DC

## 2021-04-03 MED ORDER — PROPOFOL 10 MG/ML IV BOLUS
INTRAVENOUS | Status: AC
  Filled 2021-04-03: qty 20

## 2021-04-03 NOTE — Op Note (Signed)
[facility] Patient Name: [name] Procedure Date: 04/03/2021 11:11 AM MRN: [id number] Date of Birth: 11/02/1960 Attending MD: Elon Alas. Abbey Chatters DO CSN: [id number] Age: 61 Admit Type: Outpatient Procedure:                Colonoscopy Indications:              Screening for colorectal malignant neoplasm Providers:                Elon Alas. Abbey Chatters, DO, Janeece Riggers, RN, Lambert Mody Referring MD:              Medicines:                See the Anesthesia note for documentation of the                            administered medications Complications:            No immediate complications. Estimated Blood Loss:     Estimated blood loss: none. Procedure:                Pre-Anesthesia Assessment:                           - The anesthesia plan was to use monitored                            anesthesia care (MAC).                           After obtaining informed consent, the colonoscope                            was passed under direct vision. Throughout the                            procedure, the patient's blood pressure, pulse, and                            oxygen saturations were monitored continuously. The                            [id number]-HQ190L ([id number]) scope was introduced through                            the anus and advanced to the the cecum, identified                            by appendiceal orifice and ileocecal valve. The                            patient tolerated the procedure well. The quality                            of the bowel preparation was evaluated using  the                            BBPS [facility] Bowel Preparation Scale) with scores                            of: Right Colon = 3, Transverse Colon = 3 and Left                            Colon = 3 (entire mucosa seen well with no residual                            staining, small fragments of stool or opaque                            liquid). The total BBPS score  equals 9. The                            colonoscopy was technically difficult and complex                            due to restricted mobility of the colon. Scope In: 11:24:54 AM Scope Out: 11:45:53 AM Scope Withdrawal Time: 0 hours 8 minutes 33 seconds  Total Procedure Duration: 0 hours 20 minutes 59 seconds  Findings:      The perianal and digital rectal examinations were normal.      Sigmoid colon with acute bend, difficult to pass scope. Ultimately       successful.      The exam was otherwise without abnormality. Impression:               - The examination was otherwise normal.                           - No specimens collected. Moderate Sedation:      Per Anesthesia Care Recommendation:           - Patient has a contact number available for                            emergencies. The signs and symptoms of potential                            delayed complications were discussed with the                            patient. Return to normal activities tomorrow.                            Written discharge instructions were provided to the                            patient.                           - Resume previous diet.                           -  Continue present medications.                           - Repeat colonoscopy in 10 years for screening                            purposes.                           - Return to GI clinic PRN. Procedure Code(s):        --- Professional ---                           M4268, Colorectal cancer screening; colonoscopy on                            individual not meeting criteria for high risk Diagnosis Code(s):        --- Professional ---                           Z12.11, Encounter for screening for malignant                            neoplasm of colon CPT copyright 2019 American Medical Association. All rights reserved. The codes documented in this report are preliminary and upon coder review may  be revised to meet current  compliance requirements. Elon Alas. Abbey Chatters, DO Colbert Abbey Chatters, DO 04/03/2021 11:55:32 AM This report has been signed electronically. Number of Addenda: 0

## 2021-04-03 NOTE — H&P (Signed)
Primary Care Physician:  Alanson Puls, [facility] Primary Gastroenterologist:  Dr. Abbey Chatters  Pre-Procedure History & Physical: HPI:  [name] is a 61 y.o. female is here for first ever colonoscopy for colon cancer screening purposes.  Patient denies any family history of colorectal cancer.  No melena or hematochezia.  No abdominal pain or unintentional weight loss.  No change in bowel habits.  Overall feels well from a GI standpoint.  Past Medical History:  Diagnosis Date   Anemia    NOS    Hyperlipidemia    PONV (postoperative nausea and vomiting)    Renal failure     Past Surgical History:  Procedure Laterality Date   ABDOMINAL HYSTERECTOMY     hysterectomy      Prior to Admission medications   Medication Sig Start Date End Date Taking? Authorizing Provider  acetaminophen (TYLENOL) 500 MG tablet Take 500-1,000 mg by mouth every 6 (six) hours as needed (for pain).   Yes [provider]  Cholecalciferol (VITAMIN D3 PO) Take 2 tablets by mouth in the morning and at bedtime.   Yes [provider]  meloxicam (MOBIC) 15 MG tablet Take 15 mg by mouth daily. 03/12/21  Yes [provider]  metFORMIN (GLUCOPHAGE) 500 MG tablet Take 500 mg by mouth 2 (two) times daily. 10/11/19  Yes [provider]  Multiple Vitamin (MULTIVITAMIN WITH MINERALS) TABS tablet Take 1 tablet by mouth daily after breakfast.   Yes [provider]  Na Sulfate-K Sulfate-Mg Sulf 17.5-3.13-1.6 GM/177ML SOLN Take 354 mLs by mouth as directed. 03/20/21  Yes [provider]  propranolol (INDERAL) 40 MG tablet Take 40 mg by mouth daily after breakfast.   Yes [provider]  trolamine salicylate (ASPERCREME) 10 % cream Apply 1 application topically 2 (two) times daily as needed for muscle pain (knee pain.).   Yes [provider]    Allergies as of 03/20/2021   (No Known Allergies)    Family History  Problem Relation Age of Onset   COPD Mother     Emphysema Mother     Social History   Socioeconomic History   Marital status: Married    Spouse name: Not on file   Number of children: Not on file   Years of education: Not on file   Highest education level: Not on file  Occupational History   Not on file  Tobacco Use   Smoking status: Never   Smokeless tobacco: Never  Vaping Use   Vaping Use: Never used  Substance and Sexual Activity   Alcohol use: No   Drug use: No   Sexual activity: Not on file  Other Topics Concern   Not on file  Social History Narrative   Not on file   Social Determinants of Health   Financial Resource Strain: Not on file  Food Insecurity: Not on file  Transportation Needs: Not on file  Physical Activity: Not on file  Stress: Not on file  Social Connections: Not on file  Intimate Partner Violence: Not on file    Review of Systems: See HPI, otherwise negative ROS  Physical Exam: Vital signs in last 24 hours: Temp:  [98.2 F (36.8 C)] 98.2 F (36.8 C) (01/24 1002) Resp:  [17] 17 (01/24 1002) BP: (154)/(93) 154/93 (01/24 1002) SpO2:  [100 %] 100 % (01/24 1002)   General:   Alert,  Well-developed, well-nourished, pleasant and cooperative in NAD Head:  Normocephalic and atraumatic. Eyes:  Sclera clear, no icterus.  Conjunctiva pink. Ears:  Normal auditory acuity. Nose:  No deformity, discharge,  or lesions. Mouth:  No deformity or lesions, dentition normal. Neck:  Supple; no masses or thyromegaly. Lungs:  Clear throughout to auscultation.   No wheezes, crackles, or rhonchi. No acute distress. Heart:  Regular rate and rhythm; no murmurs, clicks, rubs,  or gallops. Abdomen:  Soft, nontender and nondistended. No masses, hepatosplenomegaly or hernias noted. Normal bowel sounds, without guarding, and without rebound.   Msk:  Symmetrical without gross deformities. Normal posture. Extremities:  Without clubbing or edema. Neurologic:  Alert and  oriented x4;  grossly normal  neurologically. Skin:  Intact without significant lesions or rashes. Cervical Nodes:  No significant cervical adenopathy. Psych:  Alert and cooperative. Normal mood and affect.  Impression/Plan: CARMEL SURRENCY is here for a colonoscopy to be performed for colon cancer screening purposes.  The risks of the procedure including infection, bleed, or perforation as well as benefits, limitations, alternatives and imponderables have been reviewed with the patient. Questions have been answered. All parties agreeable.

## 2021-04-03 NOTE — Discharge Instructions (Addendum)

## 2021-04-03 NOTE — OR Nursing (Signed)
[name] had a procedure at [facility] on 04/03/21 and cannot return to work until 04/04/21 at 1:00pm.

## 2021-04-03 NOTE — Transfer of Care (Signed)
Immediate Anesthesia Transfer of Care Note  Patient: CECILIA VANCLEVE  Procedure(s) Performed: COLONOSCOPY WITH PROPOFOL  Patient Location: Endoscopy Unit  Anesthesia Type:MAC  Level of Consciousness: awake, alert , oriented and patient cooperative  Airway & Oxygen Therapy: Patient Spontanous Breathing  Post-op Assessment: Report given to RN, Post -op Vital signs reviewed and stable and Patient moving all extremities  Post vital signs: Reviewed and stable  Last Vitals:  Vitals Value Taken Time  BP    Temp 36.8 C 04/03/21 1148  Pulse    Resp 16 04/03/21 1148  SpO2 96 % 04/03/21 1148    Last Pain:  Vitals:   04/03/21 1148  TempSrc: Oral  PainSc: 0-No pain      Patients Stated Pain Goal: 8 (80/32/12 2482)  Complications: No notable events documented.

## 2021-04-03 NOTE — Anesthesia Preprocedure Evaluation (Signed)
Anesthesia Evaluation  Patient identified by MRN, date of birth, ID band Patient awake    Reviewed: Allergy & Precautions, H&P , NPO status , Patient's Chart, lab work & pertinent test results, reviewed documented beta blocker date and time   History of Anesthesia Complications (+) PONV  Airway Mallampati: II  TM Distance: >3 FB Neck ROM: full    Dental no notable dental hx.    Pulmonary neg pulmonary ROS,    Pulmonary exam normal breath sounds clear to auscultation       Cardiovascular Exercise Tolerance: Good negative cardio ROS   Rhythm:regular Rate:Normal     Neuro/Psych negative neurological ROS  negative psych ROS   GI/Hepatic negative GI ROS, Neg liver ROS,   Endo/Other  negative endocrine ROS  Renal/GU   negative genitourinary   Musculoskeletal   Abdominal   Peds  Hematology  (+) Blood dyscrasia, anemia ,   Anesthesia Other Findings   Reproductive/Obstetrics negative OB ROS                             Anesthesia Physical Anesthesia Plan  ASA: 2  Anesthesia Plan: General   Post-op Pain Management:    Induction:   PONV Risk Score and Plan: Propofol infusion  Airway Management Planned:   Additional Equipment:   Intra-op Plan:   Post-operative Plan:   Informed Consent: I have reviewed the patients History and Physical, chart, labs and discussed the procedure including the risks, benefits and alternatives for the proposed anesthesia with the patient or authorized representative who has indicated his/her understanding and acceptance.     Dental Advisory Given  Plan Discussed with: CRNA  Anesthesia Plan Comments:         Anesthesia Quick Evaluation

## 2021-04-04 NOTE — Anesthesia Postprocedure Evaluation (Signed)
Anesthesia Post Note  Patient: [name]  Procedure(s) Performed: COLONOSCOPY WITH PROPOFOL  Patient location during evaluation: Phase II Anesthesia Type: General Level of consciousness: awake Pain management: pain level controlled Vital Signs Assessment: post-procedure vital signs reviewed and stable Respiratory status: spontaneous breathing and respiratory function stable Cardiovascular status: blood pressure returned to baseline and stable Postop Assessment: no headache and no apparent nausea or vomiting Anesthetic complications: no Comments: Late entry   No notable events documented.   Last Vitals:  Vitals:   04/03/21 1002 04/03/21 1148  BP: (!) 154/93 (!) 114/58  Resp: 17 16  Temp: 36.8 C 36.8 C  SpO2: 100% 96%    Last Pain:  Vitals:   04/03/21 1148  TempSrc: Oral  PainSc: 0-No pain                 Windell Norfolk

## 2021-04-05 ENCOUNTER — Encounter (HOSPITAL_COMMUNITY): Payer: Self-pay | Admitting: Internal Medicine

## 2021-05-29 DIAGNOSIS — E785 Hyperlipidemia, unspecified: Secondary | ICD-10-CM | POA: Diagnosis not present

## 2021-05-29 DIAGNOSIS — E119 Type 2 diabetes mellitus without complications: Secondary | ICD-10-CM | POA: Diagnosis not present

## 2021-05-29 DIAGNOSIS — I10 Essential (primary) hypertension: Secondary | ICD-10-CM | POA: Diagnosis not present

## 2021-06-04 DIAGNOSIS — E119 Type 2 diabetes mellitus without complications: Secondary | ICD-10-CM | POA: Diagnosis not present

## 2021-06-04 DIAGNOSIS — I10 Essential (primary) hypertension: Secondary | ICD-10-CM | POA: Diagnosis not present

## 2021-06-04 DIAGNOSIS — E785 Hyperlipidemia, unspecified: Secondary | ICD-10-CM | POA: Diagnosis not present

## 2021-06-04 DIAGNOSIS — E559 Vitamin D deficiency, unspecified: Secondary | ICD-10-CM | POA: Diagnosis not present

## 2021-08-23 DIAGNOSIS — E669 Obesity, unspecified: Secondary | ICD-10-CM | POA: Diagnosis not present

## 2021-08-23 DIAGNOSIS — E785 Hyperlipidemia, unspecified: Secondary | ICD-10-CM | POA: Diagnosis not present

## 2021-08-23 DIAGNOSIS — E119 Type 2 diabetes mellitus without complications: Secondary | ICD-10-CM | POA: Diagnosis not present

## 2021-09-06 DIAGNOSIS — E119 Type 2 diabetes mellitus without complications: Secondary | ICD-10-CM | POA: Diagnosis not present

## 2021-09-06 DIAGNOSIS — E785 Hyperlipidemia, unspecified: Secondary | ICD-10-CM | POA: Diagnosis not present

## 2021-09-06 DIAGNOSIS — E6609 Other obesity due to excess calories: Secondary | ICD-10-CM | POA: Diagnosis not present

## 2021-09-06 DIAGNOSIS — I10 Essential (primary) hypertension: Secondary | ICD-10-CM | POA: Diagnosis not present

## 2021-10-04 DIAGNOSIS — I10 Essential (primary) hypertension: Secondary | ICD-10-CM | POA: Diagnosis not present

## 2021-10-04 DIAGNOSIS — E119 Type 2 diabetes mellitus without complications: Secondary | ICD-10-CM | POA: Diagnosis not present

## 2021-10-04 DIAGNOSIS — E785 Hyperlipidemia, unspecified: Secondary | ICD-10-CM | POA: Diagnosis not present

## 2021-10-04 DIAGNOSIS — Z79899 Other long term (current) drug therapy: Secondary | ICD-10-CM | POA: Diagnosis not present

## 2021-11-07 ENCOUNTER — Other Ambulatory Visit (HOSPITAL_COMMUNITY): Payer: Self-pay | Admitting: Nurse Practitioner

## 2021-11-07 DIAGNOSIS — E119 Type 2 diabetes mellitus without complications: Secondary | ICD-10-CM | POA: Diagnosis not present

## 2021-11-07 DIAGNOSIS — G894 Chronic pain syndrome: Secondary | ICD-10-CM | POA: Diagnosis not present

## 2021-11-07 DIAGNOSIS — E782 Mixed hyperlipidemia: Secondary | ICD-10-CM | POA: Diagnosis not present

## 2021-11-07 DIAGNOSIS — Z1231 Encounter for screening mammogram for malignant neoplasm of breast: Secondary | ICD-10-CM

## 2021-11-07 DIAGNOSIS — Z0189 Encounter for other specified special examinations: Secondary | ICD-10-CM | POA: Diagnosis not present

## 2021-11-14 DIAGNOSIS — E119 Type 2 diabetes mellitus without complications: Secondary | ICD-10-CM | POA: Diagnosis not present

## 2021-11-14 DIAGNOSIS — Z23 Encounter for immunization: Secondary | ICD-10-CM | POA: Diagnosis not present

## 2021-11-14 DIAGNOSIS — E782 Mixed hyperlipidemia: Secondary | ICD-10-CM | POA: Diagnosis not present

## 2021-11-14 DIAGNOSIS — I10 Essential (primary) hypertension: Secondary | ICD-10-CM | POA: Diagnosis not present

## 2021-11-19 ENCOUNTER — Ambulatory Visit (HOSPITAL_COMMUNITY)
Admission: RE | Admit: 2021-11-19 | Discharge: 2021-11-19 | Disposition: A | Discharge status: Home / Self Care | Payer: BC Managed Care – PPO | Source: Ambulatory Visit | Attending: Nurse Practitioner | Admitting: Nurse Practitioner

## 2021-11-19 DIAGNOSIS — Z1231 Encounter for screening mammogram for malignant neoplasm of breast: Secondary | ICD-10-CM | POA: Insufficient documentation

## 2021-11-21 DIAGNOSIS — Z23 Encounter for immunization: Secondary | ICD-10-CM | POA: Diagnosis not present

## 2021-11-21 DIAGNOSIS — Z0001 Encounter for general adult medical examination with abnormal findings: Secondary | ICD-10-CM | POA: Diagnosis not present

## 2022-03-20 DIAGNOSIS — I10 Essential (primary) hypertension: Secondary | ICD-10-CM | POA: Diagnosis not present

## 2022-03-20 DIAGNOSIS — E782 Mixed hyperlipidemia: Secondary | ICD-10-CM | POA: Diagnosis not present

## 2022-03-20 DIAGNOSIS — E119 Type 2 diabetes mellitus without complications: Secondary | ICD-10-CM | POA: Diagnosis not present

## 2022-04-20 DIAGNOSIS — I10 Essential (primary) hypertension: Secondary | ICD-10-CM | POA: Diagnosis not present

## 2022-04-20 DIAGNOSIS — E782 Mixed hyperlipidemia: Secondary | ICD-10-CM | POA: Diagnosis not present

## 2022-04-20 DIAGNOSIS — E119 Type 2 diabetes mellitus without complications: Secondary | ICD-10-CM | POA: Diagnosis not present

## 2022-04-20 DIAGNOSIS — N1831 Chronic kidney disease, stage 3a: Secondary | ICD-10-CM | POA: Diagnosis not present

## 2022-04-20 DIAGNOSIS — G894 Chronic pain syndrome: Secondary | ICD-10-CM | POA: Diagnosis not present

## 2022-04-20 DIAGNOSIS — E1122 Type 2 diabetes mellitus with diabetic chronic kidney disease: Secondary | ICD-10-CM | POA: Diagnosis not present

## 2022-05-09 ENCOUNTER — Encounter: Payer: Self-pay | Admitting: Radiology

## 2022-10-17 DIAGNOSIS — I10 Essential (primary) hypertension: Secondary | ICD-10-CM | POA: Diagnosis not present

## 2022-10-17 DIAGNOSIS — E782 Mixed hyperlipidemia: Secondary | ICD-10-CM | POA: Diagnosis not present

## 2022-10-17 DIAGNOSIS — E119 Type 2 diabetes mellitus without complications: Secondary | ICD-10-CM | POA: Diagnosis not present

## 2022-10-29 DIAGNOSIS — E1122 Type 2 diabetes mellitus with diabetic chronic kidney disease: Secondary | ICD-10-CM | POA: Diagnosis not present

## 2022-10-29 DIAGNOSIS — E782 Mixed hyperlipidemia: Secondary | ICD-10-CM | POA: Diagnosis not present

## 2022-10-29 DIAGNOSIS — G894 Chronic pain syndrome: Secondary | ICD-10-CM | POA: Diagnosis not present

## 2022-10-29 DIAGNOSIS — I10 Essential (primary) hypertension: Secondary | ICD-10-CM | POA: Diagnosis not present

## 2022-10-29 DIAGNOSIS — E119 Type 2 diabetes mellitus without complications: Secondary | ICD-10-CM | POA: Diagnosis not present

## 2022-10-29 DIAGNOSIS — N1831 Chronic kidney disease, stage 3a: Secondary | ICD-10-CM | POA: Diagnosis not present

## 2022-10-29 DIAGNOSIS — I129 Hypertensive chronic kidney disease with stage 1 through stage 4 chronic kidney disease, or unspecified chronic kidney disease: Secondary | ICD-10-CM | POA: Diagnosis not present

## 2023-04-23 DIAGNOSIS — E782 Mixed hyperlipidemia: Secondary | ICD-10-CM | POA: Diagnosis not present

## 2023-04-23 DIAGNOSIS — E119 Type 2 diabetes mellitus without complications: Secondary | ICD-10-CM | POA: Diagnosis not present

## 2023-05-01 ENCOUNTER — Other Ambulatory Visit (HOSPITAL_COMMUNITY): Payer: Self-pay | Admitting: Family Medicine

## 2023-05-01 DIAGNOSIS — N1831 Chronic kidney disease, stage 3a: Secondary | ICD-10-CM | POA: Diagnosis not present

## 2023-05-01 DIAGNOSIS — E1122 Type 2 diabetes mellitus with diabetic chronic kidney disease: Secondary | ICD-10-CM | POA: Diagnosis not present

## 2023-05-01 DIAGNOSIS — E782 Mixed hyperlipidemia: Secondary | ICD-10-CM | POA: Diagnosis not present

## 2023-05-01 DIAGNOSIS — Z1231 Encounter for screening mammogram for malignant neoplasm of breast: Secondary | ICD-10-CM

## 2023-05-01 DIAGNOSIS — I129 Hypertensive chronic kidney disease with stage 1 through stage 4 chronic kidney disease, or unspecified chronic kidney disease: Secondary | ICD-10-CM | POA: Diagnosis not present

## 2023-07-14 ENCOUNTER — Encounter (HOSPITAL_COMMUNITY): Payer: Self-pay | Admitting: Internal Medicine

## 2023-07-14 DIAGNOSIS — Z1231 Encounter for screening mammogram for malignant neoplasm of breast: Secondary | ICD-10-CM

## 2023-07-16 ENCOUNTER — Other Ambulatory Visit (HOSPITAL_COMMUNITY): Payer: Self-pay | Admitting: Family Medicine

## 2023-07-16 DIAGNOSIS — N644 Mastodynia: Secondary | ICD-10-CM

## 2023-08-05 ENCOUNTER — Other Ambulatory Visit (HOSPITAL_COMMUNITY): Payer: Self-pay | Admitting: Family Medicine

## 2023-08-05 ENCOUNTER — Ambulatory Visit (HOSPITAL_COMMUNITY)
Admission: RE | Admit: 2023-08-05 | Discharge: 2023-08-05 | Disposition: A | Discharge status: Home / Self Care | Source: Ambulatory Visit | Attending: Family Medicine | Admitting: Family Medicine

## 2023-08-05 ENCOUNTER — Encounter (HOSPITAL_COMMUNITY): Payer: Self-pay

## 2023-08-05 DIAGNOSIS — N644 Mastodynia: Secondary | ICD-10-CM | POA: Insufficient documentation

## 2023-08-05 DIAGNOSIS — R928 Other abnormal and inconclusive findings on diagnostic imaging of breast: Secondary | ICD-10-CM

## 2023-08-05 DIAGNOSIS — R92323 Mammographic fibroglandular density, bilateral breasts: Secondary | ICD-10-CM | POA: Diagnosis not present

## 2023-08-05 DIAGNOSIS — N6315 Unspecified lump in the right breast, overlapping quadrants: Secondary | ICD-10-CM | POA: Diagnosis not present

## 2023-08-05 DIAGNOSIS — N6314 Unspecified lump in the right breast, lower inner quadrant: Secondary | ICD-10-CM | POA: Diagnosis not present

## 2023-08-05 DIAGNOSIS — R590 Localized enlarged lymph nodes: Secondary | ICD-10-CM | POA: Diagnosis not present

## 2023-08-13 ENCOUNTER — Other Ambulatory Visit (HOSPITAL_COMMUNITY): Payer: Self-pay | Admitting: Family Medicine

## 2023-08-13 DIAGNOSIS — R928 Other abnormal and inconclusive findings on diagnostic imaging of breast: Secondary | ICD-10-CM

## 2023-08-14 ENCOUNTER — Encounter (HOSPITAL_COMMUNITY): Payer: Self-pay

## 2023-08-14 ENCOUNTER — Ambulatory Visit (HOSPITAL_COMMUNITY)
Admission: RE | Admit: 2023-08-14 | Discharge: 2023-08-14 | Disposition: A | Discharge status: Home / Self Care | Source: Ambulatory Visit | Attending: Family Medicine | Admitting: Family Medicine

## 2023-08-14 DIAGNOSIS — D241 Benign neoplasm of right breast: Secondary | ICD-10-CM | POA: Diagnosis not present

## 2023-08-14 DIAGNOSIS — R928 Other abnormal and inconclusive findings on diagnostic imaging of breast: Secondary | ICD-10-CM | POA: Diagnosis not present

## 2023-08-14 DIAGNOSIS — N6341 Unspecified lump in right breast, subareolar: Secondary | ICD-10-CM | POA: Diagnosis not present

## 2023-08-14 HISTORY — PX: BREAST BIOPSY: SHX20

## 2023-08-14 MED ORDER — LIDOCAINE HCL (PF) 2 % IJ SOLN
INTRAMUSCULAR | Status: AC
  Filled 2023-08-14: qty 10

## 2023-08-14 MED ORDER — LIDOCAINE-EPINEPHRINE (PF) 1 %-1:200000 IJ SOLN
10.0000 mL | Freq: Once | INTRAMUSCULAR | Status: AC
  Administered 2023-08-14: 10 mL via INTRADERMAL

## 2023-08-14 MED ORDER — LIDOCAINE HCL (PF) 2 % IJ SOLN
10.0000 mL | Freq: Once | INTRAMUSCULAR | Status: AC
  Administered 2023-08-14: 10 mL

## 2023-08-14 MED ORDER — LIDOCAINE-EPINEPHRINE (PF) 1 %-1:200000 IJ SOLN
INTRAMUSCULAR | Status: AC
  Filled 2023-08-14: qty 30

## 2023-08-14 MED ORDER — LIDOCAINE HCL (PF) 2 % IJ SOLN
INTRAMUSCULAR | Status: AC
  Filled 2023-08-14: qty 5

## 2023-08-14 NOTE — Progress Notes (Signed)
 PT tolerated right breast biopsy well today with NAD noted. PT verbalized understanding of discharge instructions and given ice packs to use. PT ambulated back to the mammogram area this time. Specimens taken to lab by Evalyn Hillier from ultrasound for processing.

## 2023-08-15 LAB — SURGICAL PATHOLOGY

## 2023-10-23 DIAGNOSIS — E119 Type 2 diabetes mellitus without complications: Secondary | ICD-10-CM | POA: Diagnosis not present

## 2023-10-23 DIAGNOSIS — E782 Mixed hyperlipidemia: Secondary | ICD-10-CM | POA: Diagnosis not present

## 2023-11-06 DIAGNOSIS — E1122 Type 2 diabetes mellitus with diabetic chronic kidney disease: Secondary | ICD-10-CM | POA: Diagnosis not present

## 2023-11-06 DIAGNOSIS — I129 Hypertensive chronic kidney disease with stage 1 through stage 4 chronic kidney disease, or unspecified chronic kidney disease: Secondary | ICD-10-CM | POA: Diagnosis not present

## 2023-11-06 DIAGNOSIS — E119 Type 2 diabetes mellitus without complications: Secondary | ICD-10-CM | POA: Diagnosis not present

## 2023-11-06 DIAGNOSIS — E782 Mixed hyperlipidemia: Secondary | ICD-10-CM | POA: Diagnosis not present

## 2023-11-06 DIAGNOSIS — G894 Chronic pain syndrome: Secondary | ICD-10-CM | POA: Diagnosis not present

## 2023-11-06 DIAGNOSIS — I10 Essential (primary) hypertension: Secondary | ICD-10-CM | POA: Diagnosis not present

## 2023-12-12 ENCOUNTER — Encounter: Payer: Self-pay | Admitting: *Deleted

## 2023-12-12 NOTE — Progress Notes (Signed)
 [name]                                          MRN: [id number]   12/12/2023   The [facility] Quality Team Specialist reviewed this patient medical record for the purposes of chart review for care gap closure. The following were reviewed: chart review for care gap closure-kidney health evaluation for diabetes:eGFR  and uACR.    [facility] Quality Team

## 2024-01-02 ENCOUNTER — Encounter: Payer: Self-pay | Admitting: *Deleted

## 2024-01-02 NOTE — Progress Notes (Signed)
 [name]                                          MRN: [id number]   01/02/2024   The [facility] Quality Team Specialist reviewed this patient medical record for the purposes of chart review for care gap closure. The following were reviewed: chart review for care gap closure-controlling blood pressure.    [facility] Quality Team
